# Patient Record
Sex: Male | Born: 1940 | Race: White | Hispanic: No | Marital: Married | Smoking: Current every day smoker
Health system: Southern US, Community
[De-identification: ages and names within clinical notes are randomized; demographics above are authoritative.]

## PROBLEM LIST (undated history)

## (undated) DIAGNOSIS — I998 Other disorder of circulatory system: Secondary | ICD-10-CM

## (undated) DIAGNOSIS — I255 Ischemic cardiomyopathy: Secondary | ICD-10-CM

## (undated) DIAGNOSIS — I739 Peripheral vascular disease, unspecified: Secondary | ICD-10-CM

## (undated) DIAGNOSIS — I4891 Unspecified atrial fibrillation: Secondary | ICD-10-CM

## (undated) DIAGNOSIS — Z4502 Encounter for adjustment and management of automatic implantable cardiac defibrillator: Secondary | ICD-10-CM

## (undated) DIAGNOSIS — I2510 Atherosclerotic heart disease of native coronary artery without angina pectoris: Secondary | ICD-10-CM

## (undated) DIAGNOSIS — Z720 Tobacco use: Secondary | ICD-10-CM

## (undated) DIAGNOSIS — J449 Chronic obstructive pulmonary disease, unspecified: Secondary | ICD-10-CM

## (undated) DIAGNOSIS — E785 Hyperlipidemia, unspecified: Secondary | ICD-10-CM

## (undated) DIAGNOSIS — Z9889 Other specified postprocedural states: Secondary | ICD-10-CM

## (undated) DIAGNOSIS — I70229 Atherosclerosis of native arteries of extremities with rest pain, unspecified extremity: Secondary | ICD-10-CM

## (undated) DIAGNOSIS — E119 Type 2 diabetes mellitus without complications: Secondary | ICD-10-CM

## (undated) DIAGNOSIS — C679 Malignant neoplasm of bladder, unspecified: Secondary | ICD-10-CM

## (undated) HISTORY — DX: Type 2 diabetes mellitus without complications: E11.9

## (undated) HISTORY — DX: Hyperlipidemia, unspecified: E78.5

## (undated) HISTORY — PX: TRANSURETHRAL RESECTION OF BLADDER TUMOR: SHX2575

## (undated) HISTORY — PX: ABDOMINAL AORTIC ANEURYSM REPAIR: SUR1152

## (undated) HISTORY — DX: Unspecified atrial fibrillation: I48.91

## (undated) HISTORY — DX: Ischemic cardiomyopathy: I25.5

## (undated) HISTORY — DX: Other disorder of circulatory system: I99.8

## (undated) HISTORY — PX: OTHER SURGICAL HISTORY: SHX169

## (undated) HISTORY — DX: Chronic obstructive pulmonary disease, unspecified: J44.9

## (undated) HISTORY — DX: Tobacco use: Z72.0

## (undated) HISTORY — PX: CARDIAC DEFIBRILLATOR PLACEMENT: SHX171

## (undated) HISTORY — PX: CHOLECYSTECTOMY: SHX55

## (undated) HISTORY — DX: Atherosclerosis of native arteries of extremities with rest pain, unspecified extremity: I70.229

## (undated) HISTORY — DX: Other specified postprocedural states: Z98.890

## (undated) HISTORY — DX: Malignant neoplasm of bladder, unspecified: C67.9

## (undated) HISTORY — DX: Atherosclerotic heart disease of native coronary artery without angina pectoris: I25.10

## (undated) HISTORY — DX: Peripheral vascular disease, unspecified: I73.9

## (undated) HISTORY — DX: Encounter for adjustment and management of automatic implantable cardiac defibrillator: Z45.02

## (undated) SURGERY — Surgical Case
Anesthesia: *Unknown

---

## 1997-10-27 ENCOUNTER — Inpatient Hospital Stay (HOSPITAL_COMMUNITY): Admission: RE | Admit: 1997-10-27 | Discharge: 1997-11-04 | Payer: Self-pay | Admitting: *Deleted

## 2001-04-03 ENCOUNTER — Ambulatory Visit (HOSPITAL_COMMUNITY): Admission: RE | Admit: 2001-04-03 | Discharge: 2001-04-03 | Payer: Self-pay | Admitting: Urology

## 2001-04-03 ENCOUNTER — Encounter: Payer: Self-pay | Admitting: Urology

## 2001-04-20 ENCOUNTER — Other Ambulatory Visit: Admission: RE | Admit: 2001-04-20 | Discharge: 2001-04-20 | Payer: Self-pay | Admitting: Urology

## 2001-06-21 ENCOUNTER — Ambulatory Visit (HOSPITAL_COMMUNITY): Admission: AD | Admit: 2001-06-21 | Discharge: 2001-06-21 | Payer: Self-pay | Admitting: Urology

## 2001-06-25 ENCOUNTER — Ambulatory Visit (HOSPITAL_COMMUNITY): Admission: RE | Admit: 2001-06-25 | Discharge: 2001-06-25 | Payer: Self-pay | Admitting: Urology

## 2001-06-26 ENCOUNTER — Emergency Department (HOSPITAL_COMMUNITY): Admission: EM | Admit: 2001-06-26 | Discharge: 2001-06-26 | Payer: Self-pay | Admitting: Emergency Medicine

## 2001-06-26 ENCOUNTER — Encounter: Payer: Self-pay | Admitting: Emergency Medicine

## 2001-06-29 ENCOUNTER — Encounter: Admission: RE | Admit: 2001-06-29 | Discharge: 2001-06-29 | Payer: Self-pay | Admitting: Oncology

## 2001-06-29 ENCOUNTER — Encounter (HOSPITAL_COMMUNITY): Admission: RE | Admit: 2001-06-29 | Discharge: 2001-07-29 | Payer: Self-pay | Admitting: Oncology

## 2001-07-05 ENCOUNTER — Inpatient Hospital Stay (HOSPITAL_COMMUNITY): Admission: RE | Admit: 2001-07-05 | Discharge: 2001-07-06 | Payer: Self-pay | Admitting: Urology

## 2001-07-17 ENCOUNTER — Ambulatory Visit (HOSPITAL_COMMUNITY): Admission: RE | Admit: 2001-07-17 | Discharge: 2001-07-17 | Payer: Self-pay | Admitting: Urology

## 2001-07-17 ENCOUNTER — Encounter: Payer: Self-pay | Admitting: Urology

## 2001-09-10 ENCOUNTER — Ambulatory Visit (HOSPITAL_COMMUNITY): Admission: RE | Admit: 2001-09-10 | Discharge: 2001-09-10 | Payer: Self-pay | Admitting: Urology

## 2001-09-10 ENCOUNTER — Encounter: Payer: Self-pay | Admitting: Urology

## 2001-10-19 ENCOUNTER — Inpatient Hospital Stay (HOSPITAL_COMMUNITY): Admission: EM | Admit: 2001-10-19 | Discharge: 2001-10-29 | Payer: Self-pay | Admitting: *Deleted

## 2001-10-19 ENCOUNTER — Encounter: Payer: Self-pay | Admitting: General Surgery

## 2001-10-19 ENCOUNTER — Encounter: Payer: Self-pay | Admitting: Emergency Medicine

## 2001-10-20 ENCOUNTER — Encounter: Payer: Self-pay | Admitting: General Surgery

## 2001-10-21 ENCOUNTER — Encounter: Payer: Self-pay | Admitting: General Surgery

## 2001-10-22 ENCOUNTER — Encounter (INDEPENDENT_AMBULATORY_CARE_PROVIDER_SITE_OTHER): Payer: Self-pay | Admitting: Cardiology

## 2001-10-23 ENCOUNTER — Encounter: Payer: Self-pay | Admitting: General Surgery

## 2002-04-11 ENCOUNTER — Ambulatory Visit (HOSPITAL_COMMUNITY): Admission: RE | Admit: 2002-04-11 | Discharge: 2002-04-11 | Payer: Self-pay | Admitting: Urology

## 2002-04-11 ENCOUNTER — Encounter: Payer: Self-pay | Admitting: Urology

## 2004-06-29 ENCOUNTER — Ambulatory Visit (HOSPITAL_COMMUNITY): Admission: RE | Admit: 2004-06-29 | Discharge: 2004-06-29 | Payer: Self-pay | Admitting: Cardiovascular Disease

## 2005-08-23 ENCOUNTER — Ambulatory Visit (HOSPITAL_COMMUNITY): Admission: RE | Admit: 2005-08-23 | Discharge: 2005-08-23 | Payer: Self-pay | Admitting: Cardiovascular Disease

## 2005-08-25 ENCOUNTER — Inpatient Hospital Stay (HOSPITAL_COMMUNITY): Admission: AD | Admit: 2005-08-25 | Discharge: 2005-08-26 | Payer: Self-pay | Admitting: Cardiovascular Disease

## 2006-02-20 ENCOUNTER — Ambulatory Visit (HOSPITAL_COMMUNITY): Admission: RE | Admit: 2006-02-20 | Discharge: 2006-02-20 | Payer: Self-pay | Admitting: Cardiovascular Disease

## 2008-01-10 ENCOUNTER — Ambulatory Visit (HOSPITAL_COMMUNITY): Admission: RE | Admit: 2008-01-10 | Discharge: 2008-01-10 | Payer: Self-pay | Admitting: General Surgery

## 2008-12-10 ENCOUNTER — Emergency Department (HOSPITAL_COMMUNITY): Admission: EM | Admit: 2008-12-10 | Discharge: 2008-12-10 | Payer: Self-pay | Admitting: Emergency Medicine

## 2009-03-23 ENCOUNTER — Encounter: Admission: RE | Admit: 2009-03-23 | Discharge: 2009-03-23 | Payer: Self-pay | Admitting: Cardiovascular Disease

## 2010-02-16 ENCOUNTER — Ambulatory Visit (HOSPITAL_COMMUNITY): Admission: RE | Admit: 2010-02-16 | Discharge: 2010-02-16 | Payer: Self-pay | Admitting: Urology

## 2010-04-13 ENCOUNTER — Ambulatory Visit (HOSPITAL_COMMUNITY): Admission: RE | Admit: 2010-04-13 | Discharge: 2010-04-13 | Payer: Self-pay | Admitting: Urology

## 2010-08-03 LAB — CBC
Hemoglobin: 12.5 g/dL — ABNORMAL LOW (ref 13.0–17.0)
MCH: 28 pg (ref 26.0–34.0)
Platelets: 116 10*3/uL — ABNORMAL LOW (ref 150–400)

## 2010-08-03 LAB — PROTIME-INR
INR: 1.36 (ref 0.00–1.49)
Prothrombin Time: 17 seconds — ABNORMAL HIGH (ref 11.6–15.2)

## 2010-08-03 LAB — SURGICAL PCR SCREEN
MRSA, PCR: NEGATIVE
Staphylococcus aureus: NEGATIVE

## 2010-08-03 LAB — APTT: aPTT: 34 seconds (ref 24–37)

## 2010-08-03 LAB — BASIC METABOLIC PANEL
CO2: 28 mEq/L (ref 19–32)
Sodium: 139 mEq/L (ref 135–145)

## 2010-08-05 LAB — COMPREHENSIVE METABOLIC PANEL
AST: 18 U/L (ref 0–37)
Alkaline Phosphatase: 120 U/L — ABNORMAL HIGH (ref 39–117)
Chloride: 103 mEq/L (ref 96–112)
Creatinine, Ser: 1.04 mg/dL (ref 0.4–1.5)
GFR calc non Af Amer: >60 mL/min (ref >60)
Total Bilirubin: 1.9 mg/dL — ABNORMAL HIGH (ref 0.3–1.2)
Total Protein: 7.6 g/dL (ref 6.0–8.3)

## 2010-08-05 LAB — PSA: PSA: 2.28 ng/mL (ref 0.10–4.00)

## 2010-08-05 LAB — URINALYSIS, ROUTINE W REFLEX MICROSCOPIC
Bilirubin Urine: NEGATIVE
Ketones, ur: NEGATIVE mg/dL
Leukocytes, UA: NEGATIVE
Specific Gravity, Urine: 1.013 (ref 1.005–1.030)
Urobilinogen, UA: 1 mg/dL (ref 0.0–1.0)

## 2010-08-05 LAB — APTT: aPTT: 35 seconds (ref 24–37)

## 2010-08-05 LAB — CBC
HCT: 40.8 % (ref 39.0–52.0)
MCHC: 33.5 g/dL (ref 30.0–36.0)
MCV: 87.4 fL (ref 78.0–100.0)
RBC: 4.67 MIL/uL (ref 4.22–5.81)
RDW: 15.6 % — ABNORMAL HIGH (ref 11.5–15.5)

## 2010-08-05 LAB — SURGICAL PCR SCREEN
MRSA, PCR: NEGATIVE
Staphylococcus aureus: NEGATIVE

## 2010-08-05 LAB — URINE CULTURE
Colony Count: NO GROWTH
Culture  Setup Time: 201109231719

## 2010-08-05 LAB — FREE PSA: PSA, Free Pct: 35 % (ref >25)

## 2010-08-29 LAB — DIFFERENTIAL
Eosinophils Absolute: 0.1 10*3/uL (ref 0.0–0.7)
Lymphs Abs: 1.5 10*3/uL (ref 0.7–4.0)
Monocytes Absolute: 1 10*3/uL (ref 0.1–1.0)
Monocytes Relative: 11 % (ref 3–12)
Neutro Abs: 6.3 10*3/uL (ref 1.7–7.7)
Neutrophils Relative %: 71 % (ref 43–77)

## 2010-08-29 LAB — URINALYSIS, ROUTINE W REFLEX MICROSCOPIC
Bilirubin Urine: NEGATIVE
Glucose, UA: 100 mg/dL — AB
Ketones, ur: NEGATIVE mg/dL
Leukocytes, UA: NEGATIVE
Nitrite: NEGATIVE
Protein, ur: NEGATIVE mg/dL
Specific Gravity, Urine: 1.02 (ref 1.005–1.030)
Urobilinogen, UA: 2 mg/dL — ABNORMAL HIGH (ref 0.0–1.0)
pH: 5.5 (ref 5.0–8.0)

## 2010-08-29 LAB — COMPREHENSIVE METABOLIC PANEL
ALT: 13 U/L (ref 0–53)
Albumin: 3.4 g/dL — ABNORMAL LOW (ref 3.5–5.2)
Calcium: 9.1 mg/dL (ref 8.4–10.5)
GFR calc Af Amer: >60 mL/min (ref >60)
Glucose, Bld: 157 mg/dL — ABNORMAL HIGH (ref 70–99)
Potassium: 4 mEq/L (ref 3.5–5.1)
Sodium: 136 mEq/L (ref 135–145)
Total Protein: 6.9 g/dL (ref 6.0–8.3)

## 2010-08-29 LAB — URINE MICROSCOPIC-ADD ON

## 2010-08-29 LAB — PROTIME-INR
INR: 2.2 — ABNORMAL HIGH (ref 0.00–1.49)
Prothrombin Time: 26.1 seconds — ABNORMAL HIGH (ref 11.6–15.2)

## 2010-08-29 LAB — CBC
Hemoglobin: 13.9 g/dL (ref 13.0–17.0)
MCHC: 34.5 g/dL (ref 30.0–36.0)
Platelets: 151 10*3/uL (ref 150–400)
RDW: 14.7 % (ref 11.5–15.5)

## 2010-08-30 ENCOUNTER — Other Ambulatory Visit: Payer: Self-pay | Admitting: Urology

## 2010-08-30 ENCOUNTER — Encounter (HOSPITAL_COMMUNITY): Payer: Medicare Other | Attending: Urology

## 2010-08-30 DIAGNOSIS — I509 Heart failure, unspecified: Secondary | ICD-10-CM | POA: Insufficient documentation

## 2010-08-30 DIAGNOSIS — C679 Malignant neoplasm of bladder, unspecified: Secondary | ICD-10-CM | POA: Insufficient documentation

## 2010-08-30 DIAGNOSIS — Z7982 Long term (current) use of aspirin: Secondary | ICD-10-CM | POA: Insufficient documentation

## 2010-08-30 DIAGNOSIS — Z951 Presence of aortocoronary bypass graft: Secondary | ICD-10-CM | POA: Insufficient documentation

## 2010-08-30 DIAGNOSIS — Z01812 Encounter for preprocedural laboratory examination: Secondary | ICD-10-CM | POA: Insufficient documentation

## 2010-08-30 DIAGNOSIS — I2510 Atherosclerotic heart disease of native coronary artery without angina pectoris: Secondary | ICD-10-CM | POA: Insufficient documentation

## 2010-08-30 DIAGNOSIS — E119 Type 2 diabetes mellitus without complications: Secondary | ICD-10-CM | POA: Insufficient documentation

## 2010-08-30 DIAGNOSIS — I10 Essential (primary) hypertension: Secondary | ICD-10-CM | POA: Insufficient documentation

## 2010-08-30 DIAGNOSIS — Z79899 Other long term (current) drug therapy: Secondary | ICD-10-CM | POA: Insufficient documentation

## 2010-08-30 DIAGNOSIS — I252 Old myocardial infarction: Secondary | ICD-10-CM | POA: Insufficient documentation

## 2010-08-30 LAB — CBC
MCH: 26.5 pg (ref 26.0–34.0)
MCHC: 32.3 g/dL (ref 30.0–36.0)
Platelets: 114 10*3/uL — ABNORMAL LOW (ref 150–400)

## 2010-08-30 LAB — SURGICAL PCR SCREEN
MRSA, PCR: NEGATIVE
Staphylococcus aureus: NEGATIVE

## 2010-08-30 LAB — BASIC METABOLIC PANEL
Calcium: 9.2 mg/dL (ref 8.4–10.5)
Creatinine, Ser: 1.06 mg/dL (ref 0.4–1.5)
GFR calc Af Amer: >60 mL/min (ref >60)
GFR calc non Af Amer: >60 mL/min (ref >60)

## 2010-08-30 LAB — PROTIME-INR
INR: 2.37 — ABNORMAL HIGH (ref 0.00–1.49)
Prothrombin Time: 26 seconds — ABNORMAL HIGH (ref 11.6–15.2)

## 2010-08-30 LAB — APTT: aPTT: 41 seconds — ABNORMAL HIGH (ref 24–37)

## 2010-09-03 ENCOUNTER — Other Ambulatory Visit: Payer: Self-pay | Admitting: Urology

## 2010-09-03 ENCOUNTER — Ambulatory Visit (HOSPITAL_COMMUNITY)
Admission: RE | Admit: 2010-09-03 | Discharge: 2010-09-03 | Disposition: A | Discharge status: Home / Self Care | Payer: Medicare Other | Source: Ambulatory Visit | Attending: Urology | Admitting: Urology

## 2010-09-03 DIAGNOSIS — Z79899 Other long term (current) drug therapy: Secondary | ICD-10-CM | POA: Insufficient documentation

## 2010-09-03 DIAGNOSIS — I252 Old myocardial infarction: Secondary | ICD-10-CM | POA: Insufficient documentation

## 2010-09-03 DIAGNOSIS — N400 Enlarged prostate without lower urinary tract symptoms: Secondary | ICD-10-CM | POA: Insufficient documentation

## 2010-09-03 DIAGNOSIS — I2510 Atherosclerotic heart disease of native coronary artery without angina pectoris: Secondary | ICD-10-CM | POA: Insufficient documentation

## 2010-09-03 DIAGNOSIS — Z8551 Personal history of malignant neoplasm of bladder: Secondary | ICD-10-CM | POA: Insufficient documentation

## 2010-09-03 DIAGNOSIS — E119 Type 2 diabetes mellitus without complications: Secondary | ICD-10-CM | POA: Insufficient documentation

## 2010-09-03 DIAGNOSIS — Z951 Presence of aortocoronary bypass graft: Secondary | ICD-10-CM | POA: Insufficient documentation

## 2010-09-03 DIAGNOSIS — Z7982 Long term (current) use of aspirin: Secondary | ICD-10-CM | POA: Insufficient documentation

## 2010-09-03 DIAGNOSIS — N3289 Other specified disorders of bladder: Secondary | ICD-10-CM | POA: Insufficient documentation

## 2010-09-03 DIAGNOSIS — I10 Essential (primary) hypertension: Secondary | ICD-10-CM | POA: Insufficient documentation

## 2010-09-18 NOTE — Op Note (Signed)
  NAME:  JOSTEN, WARMUTH NO.:  [id number]  MEDICAL RECORD NO.:  [id number]           PATIENT TYPE:  O  LOCATION:  DAYL                         FACILITY:  [facility]  PHYSICIAN:  Danae Chen, M.D.  DATE OF BIRTH:  [birth year]  DATE OF PROCEDURE:  09/03/2010 DATE OF DISCHARGE:                              OPERATIVE REPORT   PREOPERATIVE DIAGNOSIS:  Rule out recurrent bladder tumor.  POSTOPERATIVE DIAGNOSIS:  Rule out recurrent bladder tumor.  PROCEDURE:  Cystoscopy, transurethral resection bladder biopsy, and random cold cup bladder biopsy.  SURGEON:  Danae Chen, M.D.  ANESTHESIA:  General.  INDICATIONS:  The patient is a 70 year old male who had TUR of bladder tumor on February 16, 2010.  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  Repeat bladder biopsy on April 13, 2010, showed scattered fragment of high-grade TCC without invasion.  The patient was treated with intravesical BCG/interferon.  He is scheduled today for cystoscopy and TUR bladder biopsy.  DESCRIPTION OF PROCEDURE:  The patient was identified by his wristband and proper time-out was taken.  Under general anesthesia, he was prepped and draped and placed in the dorsal lithotomy position.  A panendoscope was inserted in the bladder. The urethra was normal.  He had moderate prostatic hypertrophy.  There was no stone or tumor in the bladder.  The ureteral orifices were in normal position and shape.  The bladder was then irrigated with normal saline and bladder washings were sent for cytology.  The cystoscope was then removed.  The urethra was dilated up to #28-French, then a #26 resectoscope was inserted in the bladder.  A TUR biopsy of the previous resected area of bladder tumor was done.  The resectoscope was then removed.  The cystoscope was then reinserted in the bladder and a random cold cup biopsy of the bladder was done.  The cystoscope was removed. The resectoscope was again  reinserted in the bladder and the areas of biopsy were fulgurated.  There was no evidence of bleeding at the end of the procedure.  The resectoscope was then removed.  A #16 Foley catheter was inserted in the bladder.  The patient tolerated the procedure well and left the OR in satisfactory condition to postanesthesia care unit.    Danae Chen, M.D.    MN/MEDQ  D:  09/03/2010  T:  09/03/2010  Job:  [id number]  Electronically Signed by Lindaann Slough M.D. on 09/18/2010 10:46:56 AM

## 2010-10-05 NOTE — Op Note (Signed)
NAME:  Jose Mcgrath, SHIFFLER NO.:  [id number]   MEDICAL RECORD NO.:  [id number]          PATIENT TYPE:  AMB   LOCATION:  DAY                          FACILITY:  [facility]   PHYSICIAN:  Lennie Muckle, MD      DATE OF BIRTH:  [birth year]   DATE OF PROCEDURE:  01/10/2008  DATE OF DISCHARGE:                               OPERATIVE REPORT   PREOPERATIVE DIAGNOSIS:  Right inguinal hernia.   POSTOPERATIVE DIAGNOSIS:  Right inguinal hernia.   PROCEDURE:  Open inguinal hernia repair with mesh.   SURGEON:  Lennie Muckle, MD   ASSISTANT:  None.   ANESTHESIA:  General endotracheal anesthesia.   FINDINGS:  Large hernia on the right.   BLOOD LOSS:  A minimal amount of blood loss.   COMPLICATIONS:  No immediate complications.   DRAINS:  No drains were placed.   SPECIMENS:  No specimens were sent.   INDICATIONS FOR PROCEDURE:  Mr. Pietrzak is a 70 year old male who had,  had a hernia for well over a year.  He occasionally had pain, and it was  worsened by exertion and activity.  No nausea or vomiting.  He desired  to have repair of his hernia due to increasing amounts of pain.  Informed consent was obtained after the risks of surgery, including but  not limited to bleeding, infection, hernia recurrence, risk of injury to  the spermatic cord and vessels.   DESCRIPTION OF PROCEDURE:  Identified in the preoperative holding area.  The patient was taken to the operating room.  Once in the operating  room, placed in the supine position.  After administration of general  endotracheal anesthesia, his abdomen and groin were prepped and draped  in the usual sterile fashion.  A timeout procedure was performed.  Using  anatomic landmarks for the pubic tubercle and the anterior iliac spine,  I placed an incision in the vicinity of the ilioinguinal ligament.  I  cut through subcutaneous tissues with the knife.  Using Metzenbaum  scissors, I cut through Scarpa's fascia, bringing it down into  the  external oblique fascia.  I made an incision with a #10 blade, carried  the incision in the external oblique fascia with the Metzenbaum  scissors, carrying it down to the external ring.  I encountered the  large hernia and was able to dissect out the ilioinguinal ligament and  medially on the external oblique fascia in order to encircle the hernia  and the surrounding cord and vessels.  A Penrose drain was placed around  these structures.  I then dissected out the hernia sac away from the  surrounding cord and vessels.  The hernia sac was rather large and  dense.  I was able to isolate this away from the vessels and the vas  deferens.  Proximally, it was rather densely adhered.  I used Metzenbaum  scissors to continue with the dissection.  I used the 3-0 silk suture to  cut through cremasteric muscle fibers.  I used a bipolar for a mild  amount of oozing through the smaller vessels.  I was able  to ligate the  hernia sac after ensuring no intestinal contents.  I used a 2-0 silk  suture to ligate the sac.  This was allowed to return back to the  abdominal cavity.  I then reapproximated the internal ring with 2-0  Prolene suture, allowing space for the spermatic cord and vessels.  I  then secured a piece of 3 x 6 polypropylene mesh using 2-0 Prolene  starting at the pubic tubercle and performing a running suture around  the ilioinguinal ligament and medially at the transversalis muscle and  internal oblique muscle.  I overlapped the tails around the spermatic  cord and vessels creating a new internal ring.  I was able to palpate a  finger through the opening ensuring adequate room for mild edema.  The  tails were placed under the external oblique fascia.  The wound bed was  irrigated, there was no evidence of bleeding.  I reapproximated the  external oblique fascia with a 3-0 running Vicryl suture.  Scarpa's was  reapproximated with 3-0 Vicryl suture and the dermis was closed with 3-0   Vicryl suture.  The skin was closed with 4-0 Monocryl and Dermabond  placed around the dressing.  The patient was extubated and transferred  to the postanesthesia care unit in stable condition.  He will follow up  with me in approximately 2-3 weeks.  [name]  He will be given Percocet and  start ibuprofen tomorrow, as well as restart his Coumadin tomorrow.      Lennie Muckle, MD  Electronically Signed     ALA/MEDQ  D:  01/10/2008  T:  01/10/2008  Job:  [id number]   cc:   Dr. Burman Riis

## 2010-10-08 NOTE — Discharge Summary (Signed)
NAME:  Jose Mcgrath, Jose Mcgrath NO.:  [id number]   MEDICAL RECORD NO.:  [id number]          PATIENT TYPE:  INP   LOCATION:  6525                         FACILITY:  [facility]   PHYSICIAN:  Darcella Gasman. [place], N.P.  DATE OF BIRTH:  1940-11-13   DATE OF ADMISSION:  08/25/2005  DATE OF DISCHARGE:  08/26/2005                                 DISCHARGE SUMMARY   DISCHARGE DIAGNOSES:  1.  Status post Bi-V ICD implantation.  2.  Ischemic cardiomyopathy with class III heart failure with EF 25-30%.  3.  Chronic atrial fibrillation.  4.  Positive tobacco use.  5.  Abdominal aortic aneurysm resection by Dr. Madilyn Fireman in the past.  6.  Diabetes mellitus.  7.  Moderate hematoma at the defibrillator site.   CONDITION ON DISCHARGE:  Stable.   DISCHARGE MEDICATIONS:  1.  Cardec 12.5 mg in the morning and 6.25 in the evening.  2.  No Coumadin until Sunday evening, then  5 mg daily.  3.  May begin aspirin 81 mg on Saturday.  4.  Diltiazem 20 daily.  5.  Lanoxin 0.25 mg daily.  6.  Accupril 20 mg daily.  7.  Aldactone 25 mg one half tablet daily.  8.  Furosemide 40 mg daily.  9.  No Avandamet until Saturday evening and b.i.d. as before.   DISCHARGE INSTRUCTIONS:  1.  Low-fat diabetic diet, low-salt.  2.  See pacemaker discharge instruction sheet.  3.  Follow with Dr. Alanda Amass on August 30, 2005, they7 will call you with      the time.  4.  Have pro time done on Tuesday.   HISTORY OF PRESENT ILLNESS:  This is a white married male patient of Dr.  Kandis Cocking married with two children, four grandchildren and two great  grandchildren with class 3 heart failure, permanent atrial fibrillation on  Coumadin multivessel bypass grafting x4 in 1999 with recent Cardiolite study  negative for CA on June 16, 2005.  He has dilated LV at the left bundle  branch.  EF was 25-30%.  On 2D echocardiogram June 16, 2005, he had no  significant valvular disease, and intraventricular conduction had a remote  MI prior to 1999.  He has been treated medically for his dilated history of  cardiomyopathy.   It was thought he was a candidate for prophylactic ICD by the pacing unit.  He was admitted August 25, 2005 for elective procedure.   PAST MEDICAL HISTORY:  1.  AAA resection without pace.  2.  Tobacco abuse.  Continues to smoke.  3.  COPD.  4.  History of chronic atrial fibrillation.  Outpatient Holter showed      nonsustained ventricular tachycardia, atrial fibrillation with variable      rates.  5.  Diabetes mellitus type 2.  6.  Recent episode of overt heart failure over the last several months,      treated with diuretics.   FAMILY HISTORY/SOCIAL HISTORY/REVIEW OF SYSTEMS:  See HPI.   DISCHARGE PHYSICAL EXAMINATION:  VITAL SIGNS:  Blood pressure 150/74, pulse  98, respirations 18, temperature 99.8, oxygen saturation 92% on  room air.   LABORATORY DATA:  Post procedure, hemoglobin 14.3, hematocrit 41.1, WBC  11.4, platelets 150, sodium 135, potassium 3.3, BUN 11, creatinine 0.9,  glucose 192.  Chest x-ray showed no pneumothorax.  Stable EKG.  HEART:  Regular rate and rhythm.  LUNGS:  Clear.  ABDOMEN:  Soft, nontender.  Left subclavian pacer site with hematoma,  moderate size, 2-3 cm below the pacemaker generator.   HOSPITAL COURSE:  Mr. Kille was admitted by Dr. Alanda Amass for placement of  IV ICD for class 3 heart failure and chronic atrial fibrillation with  nonsustained ventricular tachycardia and prophylaxis to prevent sudden  death.  He tolerated the procedure well, and by the next morning he was  stable and felt he could be discharged home.  He will follow up with Dr.  Alanda Amass in the [place] office on Tuesday, April 10 for further  evaluation.      Darcella Gasman. Annie Paras, N.P.     LRI/MEDQ  D:  08/29/2005  T:  08/30/2005  Job:  [id number]   cc:   Kirk Ruths, M.D.  Fax: [phone]
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
## 2010-10-08 NOTE — Op Note (Signed)
NAME:  Jose Mcgrath, Jose Mcgrath NO.:  [id number]   MEDICAL RECORD NO.:  [id number]          PATIENT TYPE:  INP   LOCATION:  6525                         FACILITY:  [facility]   PHYSICIAN:  Richard A. Alanda Amass, M.D.DATE OF BIRTH:  Jul 04, 1940   DATE OF PROCEDURE:  08/25/2005  DATE OF DISCHARGE:                                 OPERATIVE REPORT   PROCEDURE:  Implantation of biventricular ICD Medtronic wireless Concerto  model number [id number]; SN: [id number] with active fixation screw-in  Medtronic dual coil rate sense defibrillator lead number 6949 - 65 cm  steroid eluding SN:  [id number] and Medtronic bipolar passive fixation  steroid eluding coronary sinus - LV lead number [id number] - 88 cm; SN:  [id number].  Coronary sinus angiogram, DFT testing using verification with  successful device defibrillation with induced VF at 20 joules delivered  energy. High output generator implant.   IMPLANTING PHYSICIAN:  Richard A. Alanda Amass, M.D.   COMPLICATIONS:  None.   ESTIMATED BLOOD LOSS:  Approximately 70 mL.   ANTIBIOTIC PREOPERATIVE PROPHYLAXIS:  1 gram Ancef IV to be continued  postoperatively.   ANESTHESIA:  5 mg Valium p.o. premedication, 1% local Xylocaine, 5 mg Versed  IV in divided doses, 2 mg Nubain in divided doses, 100 mcg fentanyl in  divided doses, 0.25 mg Romazicon for Versed reversal postop.   PREOPERATIVE DIAGNOSES:  1.  Ischemic congestive cardiomyopathy.  2.  Recent class IV heart failure, current class III heart failure on      optimal medical therapy.  3.  Remote coronary artery bypass graft x4 in 1999.  4.  Remote myocardial infarction DMI in 1999.  5.  Remote abdominal aortic aneurysm resection in 1999.  6.  Adult onset diabetes mellitus.  7.  Nonsustained VT asymptomatic.  8.  Sick sinus syndrome permanent atrial fibrillation with giant atrium.  9.  Left bundle branch block on EKG with QRS 0.138.   Jose Mcgrath is a 70 year old, married, father of  2 with 4  grandchildren, 2  great grandchildren who has an ischemic cardiomyopathy with recent class IV  heart failure. He is on optimal medical therapy with permanent atrial  fibrillation and left bundle branch block.  He meets criteria for ICD  implantation based on both SCD-HeFT and made it to criteria for EF less than  30% documented on 2-D echo and past Cardiolite, prior myocardial infarction.  He meets criteria for biventricular pacing with left bundle branch block  intraventricular dyssynchrony on 2-D echo with prolonged QRS.  Informed  consent was obtained from the patient and his wife to proceed with  implantation and DFT testing.   THRESHOLDS:  RV:  R equal 17.0 MV, impedance 591 ohms, threshold 0.5 V at  0.5 milliseconds.   LV:  R equal 13.6 MV, impedance 414 ohms, threshold 1.7 V at 0.5  milliseconds.   Device measurements postoperatively were similar to lead measurements post  implantation.   Current of entry was good and there was no diaphragmatic or esophageal  pacing at 10 volt output on RV or LV leads through PSA.   The patient  was brought to the second floor CP lab in a post absorptive  state after 5 mg of Valium p.o. premedication.  Both the right groin and  left anterior chest were prepped, draped in the usual manner.  1% Xylocaine  was used for local anesthesia for the left anterior chest and an  infraclavicular curvilinear transverse incision was performed.  The patient  had been on Coumadin which was held preoperatively and INR was 1.21 on the  day of the procedure.  He also had been on baby aspirin which was held on  the day of the procedure.  The incision was brought down to the prepectoral  fascia using blunt dissection and electrocautery to control hemostasis.  The  extrathoracic subclavian vein was entered under fluoroscopic control with  two separate punctures with a #18 thin-wall needle.  Initially a 7-French  introducer was used for the RV lead.  The RV lead was  positioned in the RV  apex and initially a passive fixation Medtronic electrode was utilized,  Medtronic 6948 - 65 cm.  Because of the extremely large heart, there was  some difficulty in positioning this but this was positioned in the RV apex  and appeared stable.  Through the second puncture, we then introduced a 9.5  [place] safe sheath.  Through this, we initially used a Attain MB 2 guiding  sheath.  This however was too short with not enough curve to adequately  reach the coronary sinus, although the coronary sinus was located.  Also the  RV lead was dislodged during manipulation of the Attain sheath.  The Attain  sheath was removed, the RV lead was removed and another puncture was done  with an 18 thin-wall needle under fluoroscopic control to the extrathoracic  subclavian and a #9 peel-away Cook introducer was introduced along with a  Medtronic 6949, 65 cm active fixation screw-in electrode.  The electrode was  positioned in the RV apex and screwed in under fluoroscopic control and  remained stable throughout the remainder of the procedure. Backup pacing was  not necessary but was available during the procedure.  We then used a  multipurpose Attain [id number] guiding sheath through the previously placed safe  sheath.  We were able to selectively catheterize the coronary sinus with  this and a Wholey wire was used to enter the coronary sinus to the mid  portion. 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  We were able to get the  guiding sheath into the proximal portion of the coronary sinus but then  there was a very acute bend beyond this to get into the main coronary sinus.  We were not able to get the guiding sheath up with that.  We had to use  several Attain select catheters. We also attempted to put a balloon catheter  in however, we did not have a long exchange wire so this was not possible so this was  abandoned.  Through Attain select number [phone] 6-French subselective  catheter, we were able to subselect a large posterolateral vein.  Prior to  this, coronary sinus angiography was done in the PA, RAO and LAO projections  through the guiding sheath and subselective injections were done through the  subselective catheter.  This demonstrated a large inferior vein that was not  suitable because of its location for LV lead placement.  There was only one  large posterolateral vein visualized  which was our target vein.  Through the  subselective catheter, I  was then able to pass a Wholey wire a little bit  further into the posterolateral vein, advance the subselective catheter,  remove the Wholey wire.  I then used a 0.014 inch Asahi soft guidewire  initially.  The subselective catheter was removed and we tried to thread a  bipolar lead over this wire into the coronary posterolateral vein however,  because of the marked tortuosity and bend proximally and the proximal bend  of the posterolateral vein, we were not able to get across this so this was  removed.  We then passed the subselective catheter back over the guidewire  and changed guidewires to a size 0.014 inch [place]ater wire which was stiffer.  This was positioned in the distal port of the posterolateral vein,  subselective catheter was removed.  I was then able through careful  manipulation and push pull technique able to get the bipolar electrode  around the proximal coronary sinus bend and angle and around the proximal  posterolateral vein angle, out into the distal portion of the posterolateral  vein.  It was quite stable in this position and there was no diaphragmatic  pacing at 10 volt output bipolar.  Several configurations were tried for  obtaining an adequate threshold.  The best threshold obtained was using an  integrated bipolar configuration with the cathode on the LV proximal ring  and the anode on the RV coil.  There was no  diaphragmatic pacing with this  and this provided the best stimulation threshold and excellent R waves with  good current of entry.  The lead was stable in this position.  Under  fluoroscopic control, the sheath was then pulled back to the right atrium.  The safe sheath was slid under fluoroscopic control and then the  multipurpose guiding sheath was then slit using slitting technique.  The  lead remained stable without any movement.  The atrial electrode was secured  at the insertion site with the previously placed #1 figure-of-eight silk  suture to prevent migration.  The extra stainless steel J-tip guidewire had  already been removed after positioning the RV electrode.  The RV electrode  was likewise secured with a #1 silk suture, a figure-of-eight silk suture  around the tissue sewing collar to prevent migration.  Each lead was further  secured two #1 silk sutures around a Silastic sewing collar for each  electrode.  Despite considerable electrocautery probably related to the patient's past Coumadin and aspirin therapy, there was considerable oozing  which required electrocautery.  Avitene was used in the pocket and was  fairly successful but I had to use a thrombin mixture 1 mL in the pocket to  help obtain hemostasis.  The generator was then hooked to the electrodes and  the proper sequence confirmed by serial numbers.  The atrial port was  plugged with an atrial plug.  A hex nut was tightened for each electrode.  The pin plug was a Medtronic [id number]. The pocket had previously been irrigated  with 500 mg of kanamycin solution.  The generator was delivered into the  pocket with the electrodes looped behind and loosely secured at the  underlying muscle and fascia with #1 silk suture to prevent migration.  The  subcutaneous tissue was closed with two separate running layers of Vicryl  suture and the skin was closed with 4-0 subcuticular Vicryl suture, Steri-  Strips were applied.  The wound  was dry, there  was no bleeding around the  incision site but extra pressure was held after closing the wound.  Wireless  VF induction was then done through the H[employer]ler Medtronic ICD.  VF was  induced with shock on T with a 400 millisecond pacing train, a coupling  interval of 300 milliseconds and a 1 joule shock on T.  VF was induced.  Two  external bipolar cardioverters were prepared and ready and prior to VF  induction, the patient was adequately anesthetized by respiratory therapy.  VF was detected without dropouts with sensing at 1.2 mV and the patient was  successfully rescued with a single verification DFT shock of 20 joules  promptly to sinus rhythm.  The episode had lasted 6 seconds and high voltage  impedance was 45 ohms.  The patient tolerated this well.  He was given 2.25  mg for Romazicon to partially reverse his Versed.  He tolerated the  procedure well and was transferred from the laboratory to the holding area  for postoperative programming in stable condition.      Richard A. Alanda Amass, M.D.  Electronically Signed     RAW/MEDQ  D:  08/25/2005  T:  08/26/2005  Job:  [id number]   cc:   CP Lab   Kirk Ruths, M.D.  Fax: [phone]   EP Lab

## 2010-10-08 NOTE — Discharge Summary (Signed)
[facility]  Patient:    Jose Mcgrath, Jose Mcgrath Visit Number: [id number] MRN: [id number]          Service Type: TRA Location: 4700 [id number] Attending Physician:  Trauma, Md Dictated by:   Dennie Maizes, M.D. Admit Date:  10/19/2001 Discharge Date: 10/29/2001               Karleen Hampshire, M.D.   Discharge Summary  CONSULTING PHYSICIAN:  Karleen Hampshire, M.D.  FINAL DIAGNOSES: 1. 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 2. Hematuria. 3. Coronary artery disease. 4. Hypertension.  OPERATIVE PROCEDURE:  Cystoscopy, multiple bladder biopsies, and transurethral resection of the bladder tumor, July 05, 2001.  COMPLICATIONS:  None.  DISCHARGE SUMMARY:  This 70 year old male was referred to me by Dr. Regino Schultze for evaluation of intermittent mild gross hematuria.  The patient has been on anticoagulation therapy for several years.  He has noticed intermittent mild hematuria about 6 months ago which cleared spontaneously.  He had recurrent hematuria a few months ago and he was referred to me for further evaluation. The patient did not have any voiding difficulty.  He has good urinary flow. He has urinary frequency x5, and nocturia x1.  He has no fever, flank pain, chills, dysuria, or urinary incontinence.  There is no past history of urinary tract infection or urolithiasis.  PAST MEDICAL HISTORY: 1. History of coronary artery disease, status post coronary artery bypass    grafting 3 years ago. 2. Status post abdominal aortic aneurysm repair. 3. History of hypertension. 4. There is no history of diabetes mellitus. 5. He had borderline hypertension which is controlled with diet.  MEDICATIONS: 1. Lanoxin. 2. Diltiazem. 3. Coumadin 10 mg 1 p.o. q.d. to stop for the surgery.  ALLERGIES:  None.  FAMILY HISTORY:  Negative for GU disease, kidney disease or malignancy.  REVIEW OF SYSTEMS:  Positive for perioral numbness, and tingling, high  blood pressure and urinary frequency.  PHYSICAL EXAMINATION:  GENERAL:  Height 6 feet 2 inches, weight 235 pounds.  HEENT:  Normal.  NECK:  No masses.  LUNGS: Clear to auscultation.  HEART:  Irregular rate and rhythm with no murmurs.  Medium sternotomy scar is noted.  ABDOMEN:  Midline incision is noted.  No costovertebral angle tenderness. Bladder not palpable.  GENITOURINARY:  Penis and testes are normal.  RECTAL:  Benign prostate.  The patient underwent further evaluation in the office.  Renal function was normal with a BUN of 12, creatinine 1.0, urine cytology revealed no evidence of malignant cells.  A repeat tomogram done at [facility] revealed normal kidneys and collecting system. There was a large lobulated filling defect in the left lateral wall of the bladder.  HOSPITAL COURSE:  The patient Coumadin stopped and the PT and PTT came within normal range prior to surgery.  He was taken to the OR on July 05, 2001. Under spinal anesthesia cystoscopy and multiple bladder biopsies and transurethral resection of a 4 cm x 3 cm sized bladder tumor were done. The patient was seen by Dr. Nobie Putnam and Dr. Sherwood Gambler in follow up.  He did well postoperatively.  His labs are within the normal range.  The bladder irrigation was discontinued and the urine was clear.  The Foley catheter was discontinued.  The patient received lovenox until his INR became therapeutic. His Coumadin was restarted in the postoperative period.  DISPOSITION:  The patient was discharged and sent home on July 06, 2001.  FOLLOWUP:  An appointment with his  the patients cardiologist has been made for regulating the doses of Coumadin.  FINAL PATHOLOGY:  Final pathology of the bladder biopsies revealed no evidence of malignancy. 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  The patient will be reviewed in the office  in 2 weeks and further staging of the carcinoma of the bladder will be done. Dictated by:   Dennie Maizes, M.D. Attending Physician:  Trauma, Md DD:  10/24/01 TD:  10/29/01 Job: [id number]

## 2010-10-08 NOTE — Procedures (Signed)
   NAME:  Jose Mcgrath, Jose Mcgrath                        ACCOUNT NO.:  [id number]   MEDICAL RECORD NO.:  [id number]                   PATIENT TYPE:  NP   LOCATION:  4714                                 FACILITY:  [facility]   PHYSICIAN:  Fredirick Maudlin, M.D.              DATE OF BIRTH:  1941/02/16   DATE OF PROCEDURE:  10/19/2001  DATE OF DISCHARGE:  10/29/2001                                EKG INTERPRETATION   The rhythm is atrial fibrillation with a ventricular response in the 70s.  There are T-wave abnormalities which are diffuse, but nonspecific.  Abnormal  electrocardiogram.                                               Fredirick Maudlin, M.D.    ELH/MEDQ  D:  12/20/2001  T:  12/26/2001  Job:  [id number]

## 2010-11-25 ENCOUNTER — Other Ambulatory Visit (HOSPITAL_COMMUNITY): Payer: Self-pay | Admitting: Cardiovascular Disease

## 2010-11-25 ENCOUNTER — Ambulatory Visit (HOSPITAL_COMMUNITY)
Admission: RE | Admit: 2010-11-25 | Discharge: 2010-11-25 | Disposition: A | Discharge status: Home / Self Care | Payer: Medicare Other | Source: Ambulatory Visit | Attending: Cardiovascular Disease | Admitting: Cardiovascular Disease

## 2010-11-25 DIAGNOSIS — Z01811 Encounter for preprocedural respiratory examination: Secondary | ICD-10-CM

## 2010-11-25 DIAGNOSIS — Z01818 Encounter for other preprocedural examination: Secondary | ICD-10-CM | POA: Insufficient documentation

## 2010-11-30 ENCOUNTER — Ambulatory Visit (HOSPITAL_COMMUNITY)
Admission: RE | Admit: 2010-11-30 | Discharge: 2010-12-01 | Disposition: A | Discharge status: Home / Self Care | Payer: Medicare Other | Source: Ambulatory Visit | Attending: Cardiovascular Disease | Admitting: Cardiovascular Disease

## 2010-11-30 DIAGNOSIS — Z4502 Encounter for adjustment and management of automatic implantable cardiac defibrillator: Secondary | ICD-10-CM | POA: Insufficient documentation

## 2010-11-30 DIAGNOSIS — E785 Hyperlipidemia, unspecified: Secondary | ICD-10-CM | POA: Insufficient documentation

## 2010-11-30 DIAGNOSIS — Z8249 Family history of ischemic heart disease and other diseases of the circulatory system: Secondary | ICD-10-CM | POA: Insufficient documentation

## 2010-11-30 DIAGNOSIS — E119 Type 2 diabetes mellitus without complications: Secondary | ICD-10-CM | POA: Insufficient documentation

## 2010-11-30 DIAGNOSIS — I10 Essential (primary) hypertension: Secondary | ICD-10-CM | POA: Insufficient documentation

## 2010-11-30 DIAGNOSIS — J449 Chronic obstructive pulmonary disease, unspecified: Secondary | ICD-10-CM | POA: Insufficient documentation

## 2010-11-30 DIAGNOSIS — J4489 Other specified chronic obstructive pulmonary disease: Secondary | ICD-10-CM | POA: Insufficient documentation

## 2010-11-30 DIAGNOSIS — I739 Peripheral vascular disease, unspecified: Secondary | ICD-10-CM | POA: Insufficient documentation

## 2010-12-01 ENCOUNTER — Ambulatory Visit (HOSPITAL_COMMUNITY): Payer: Medicare Other

## 2010-12-01 LAB — PROTIME-INR
INR: 0.91 (ref 0.00–1.49)
Prothrombin Time: 12.5 seconds (ref 11.6–15.2)

## 2010-12-27 NOTE — Op Note (Signed)
NAME:  Jose Mcgrath, Jose Mcgrath NO.:  [id number]  MEDICAL RECORD NO.:  [id number]  LOCATION:                                 FACILITY:  PHYSICIAN:  Thurmon Fair, MD     DATE OF BIRTH:  1941/04/14  DATE OF PROCEDURE:  11/30/2010 DATE OF DISCHARGE:                              OPERATIVE REPORT   PROCEDURES PERFORMED: 1. Implantation of new right ventricular defibrillator lead. 2. Replacement of biventricular pacemaker/defibrillator generator due     to device at elective replacement interval. 3. Fluoroscopy. 4. Left upper extremity venogram. 5. Moderate sedation. 6. Defibrillation threshold testing lead and generator testing.  REASON FOR THE PROCEDURE: 1. Defibrillator generator at elective replacement interval. 2. Chronic defibrillator lead subject to recall (Medtronic Sprint     Fidelis defibrillator lead).  Medications administered in the procedure were Ancef 2 g IV, Versed 6 mg IV, and fentanyl 150 mcg IV, and lidocaine 1% 30 mL locally.  PROCEDURE PERFORMED BY:  Thurmon Fair, MD  COMPLICATIONS:  None.  ESTIMATED BLOOD LOSS:  10 mL.  Jose Mcgrath is a 70 year old man with severe congestive heart failure, severely depressed left ventricle systolic function and permanent atrial fibrillation who has a biventricular pacemaker operating as a single chamber device (atrial port is plugged due to atrial fibrillation and high-grade atrioventricular block).  His defibrillator lead is a Sprint Fidelis lead.  While this has not yet shown evidence of lead fracture or any changes impedance, the decision was made to place a new defibrillator lead at the time of elective generator replacement.  DEVICE DETAILS:  The new generator is a Medtronic Protecta model number [id number], serial number [id number].  The new defibrillator lead is a Child psychotherapist model number [id number] cm, serial number [id number].  After risks and benefits of the procedure  were described, the patient provided informed consent.  He was brought to the cardiac cath lab in a fasting state and the chest was prepped and draped in usual sterile fashion.  Local anesthesia 1% lidocaine was administered to the left prepectoral area in the area of the previously placed defibrillator.  A 6-cm horizontal incision was made parallel with the inferior border of the left clavicle.  Limited electrocautery was used and mostly blunt dissection was used to expose the chronic defibrillator pocket.  The defibrillator was explanted without difficulty.  The pocket was inspected for hemostasis which was found to be excellent.  Great care was taken at all times to avoid any injury to the chronic leads.  The 2 antibiotics soaked sponges were placed in the pocket at this point in the procedure.  Left upper extremity venogram had been performed and showed that the left subclavian vein was patent all the way to the superior vena cava. Using the modified Seldinger technique, a 9.5 French safe sheath was placed in the superior vena cava.  The [facility] wire was required to negotiate the tortuous vessel.  Subsequently, a new defibrillator lead was placed via the sheath and advanced into the level of the right atrium.  Considerable difficulty was encountered crossing the tricuspid valve due to a severely dilated right atrium and what appears to be severe tricuspid  insufficiency. Several shaped stylets were used to cross the tricuspid valve and obtain a stable position in the right ventricle.  The right ventricle itself also appeared to be markedly dilated.  The defibrillator lead preferentially engaged the coronary sinus repeatedly.  Eventually, the lead was placed in a satisfactory position in the mid to apical portion of the right ventricular septum.  The active fixation helix was deployed and prominent current of injury was noted.  Satisfactory sensing and pacing parameters were  encountered as well as good electrical impedance.  The safe sheath was peeled away and the lead was secured in place using 2-0 silk.  Subsequently, the chronic right ventricular lead and left ventricular lead were disconnected from the old generator.  The newly placed right ventricular lead was attached to the new biventricular pacemaker defibrillator generator.  The chronic left ventricular lead was tested and found to have parameters were unchanged from the preoperative procedure.  The left ventricular lead was also attached to the new generator.  The atrial port was plugged.  The chronic defibrillator lead was left in place and its 3-pins were all capped.  The pocket was reinspected for hemostasis after the antibiotic soaked sponges had been removed.  The new generator was then placed in the pocket with great care being taken that the active leads as well as the capped leads were located deep to the generator.  The pocket was then closed in layers using two layers of 2-0 Vicryl and titanium staples.  Defibrillation threshold testing was then performed.  Ventricular fibrillation was successfully induced with a 1 Joule shock on T procedure.  A single 25 joules shock successfully returned the rhythm to normal sinus rhythm without any dropout.  The total duration of ventricular fibrillation was 9 seconds.  The charge time was 4.9 seconds.  The high-voltage impedance was 33 ohms.  The surgical wound was then covered with a sterile dressing.  At the end of the procedure, the following electronic parameters were encountered.  Right ventricular lead sensed R-wave 8.9 mV, impedance 633 ohms, threshold 0.6 volts at 0.5 milliseconds pulse width, current 0.9 mA, high-voltage impedance proximal coil 38 ohms, distal coil 31 ohms (during actual shock 33 ohms).  Impedance 342 ohms and threshold 2.25 volts at 1.0 milliseconds pulse width (best configuration LV tip to RV coil).  Otherwise,  the device was reprogrammed as the old generator had been programmed.  No immediate complications occurred.     Thurmon Fair, MD     MC/MEDQ  D:  12/20/2010  T:  12/21/2010  Job:  [id number]  cc:   [facility]  Electronically Signed by Thurmon Fair M.D. on 12/27/2010 04:43:49 PM

## 2012-06-15 LAB — COMPREHENSIVE METABOLIC PANEL
Albumin: 3.1
BUN: 23 mg/dL — AB (ref 4–21)
CO2: 23 mmol/L
Chloride: 105 mmol/L
Creat: 0.97
Glucose: 127
Potassium: 4.7 mmol/L

## 2012-06-15 LAB — CBC WITH DIFFERENTIAL/PLATELET
MCH: 23.9
MCHC: 31.7
RDW: 16.9

## 2012-06-19 ENCOUNTER — Ambulatory Visit (INDEPENDENT_AMBULATORY_CARE_PROVIDER_SITE_OTHER): Payer: Medicare Other | Admitting: Gastroenterology

## 2012-06-19 ENCOUNTER — Encounter (HOSPITAL_COMMUNITY): Payer: Medicare Other | Attending: Gastroenterology

## 2012-06-19 ENCOUNTER — Encounter: Payer: Self-pay | Admitting: Gastroenterology

## 2012-06-19 ENCOUNTER — Other Ambulatory Visit: Payer: Self-pay

## 2012-06-19 VITALS — BP 130/60 | HR 68 | Temp 97.2°F | Ht 74.0 in | Wt 202.4 lb

## 2012-06-19 VITALS — BP 136/80 | HR 81 | Temp 97.1°F | Resp 20

## 2012-06-19 DIAGNOSIS — D649 Anemia, unspecified: Secondary | ICD-10-CM

## 2012-06-19 DIAGNOSIS — Z1211 Encounter for screening for malignant neoplasm of colon: Secondary | ICD-10-CM

## 2012-06-19 DIAGNOSIS — R748 Abnormal levels of other serum enzymes: Secondary | ICD-10-CM | POA: Insufficient documentation

## 2012-06-19 LAB — CBC WITH DIFFERENTIAL/PLATELET
Lymphocytes Relative: 20 % (ref 12–46)
Lymphs Abs: 1.4 10*3/uL (ref 0.7–4.0)
Neutrophils Relative %: 64 % (ref 43–77)
Platelets: 228 10*3/uL (ref 150–400)
RBC: 3.4 MIL/uL — ABNORMAL LOW (ref 4.22–5.81)
WBC: 6.7 10*3/uL (ref 4.0–10.5)

## 2012-06-19 LAB — PROTIME-INR
INR: 1.52 — ABNORMAL HIGH (ref 0.00–1.49)
Prothrombin Time: 17.9 seconds — ABNORMAL HIGH (ref 11.6–15.2)

## 2012-06-19 MED ORDER — OMEPRAZOLE 20 MG PO CPDR: 20.0000 mg | DELAYED_RELEASE_CAPSULE | Freq: Two times a day (BID) | ORAL | Status: DC

## 2012-06-19 NOTE — Addendum Note (Signed)
Addended by: Edythe Lynn A on: 06/19/2012 03:19 PM   Modules accepted: Orders, SmartSet

## 2012-06-19 NOTE — Progress Notes (Signed)
Luanna Cole presented for labwork. Labs per MD order drawn via Peripheral Line 25 gauge needle inserted in lt ac. Procedure without incident.  Needle removed intact. Patient tolerated procedure well. Expressed to patient concern over outpatient transfusion tomorrow based on his weakness. His NP has advised him to go thru ED for admission but pt refused. Labs pending. Pt comfortable in recliner.   HGB 8.1. Patient will return tomorrow for 1 unit prbc. Instructed to report to ed if condition worsens.

## 2012-06-19 NOTE — Assessment & Plan Note (Signed)
Upon review of labs in epic, appears isolated elevated alk phos in the past, including most recently mildly elevated at 130. Transaminases normal. Platelets normal but mildly low albumin. Will likely need further work-up once acute issue is resolved. Consider imaging in near future.

## 2012-06-19 NOTE — Progress Notes (Signed)
Faxed to PCP

## 2012-06-19 NOTE — Progress Notes (Signed)
Primary Care Physician:  Kirk Ruths, MD Primary Gastroenterologist:  Dr. Darrick Penna  Chief Complaint  Patient presents with  . Rectal Bleeding  . Abdominal Pain    HPI:   Jose Mcgrath is a 72 year old male who presents today at the request of Dr. Alanda Amass secondary to anemia. He has multiple comorbidities as listed in PMH; he is on Coumadin secondary to afib. Labs from Jan 23th  note Hgb 7.9, microcytic. This is a significant drop, with prior Hgb 11.4 in March 2013. Upon presentation to [place] today, original manual BP was 80/60. Pulse high 50s. Rechecked orthostatics and BP was consistently 130/60s. Drove by himself. Wife at home with grandkids.   Notes feeling weak, fatigued. Present for a few months. Thought he was just "down". Started to feel "so drunk" I couldn't get around. Fell several times. Never sought medical care. Feels weak, wakes up hungry every morning. Good appetite. Denies wt loss. No N/V. Denies abdominal pain. +melena reportedly a few weeks ago. intermittent hematochezia with straining. No dysphagia. No indigestion. Advil prn, last took a few weeks ago for a toothache.   Just had blood work completed today. No blood transfusion.   No prior colonoscopy. Thinks he may have had an upper endoscopy "years ago".  LAST DOSE OF COUMADIN Monday. HE IS REFUSING HOSPITAL ADMISSION. ADAMANT DESPITE DISCUSSION OF RISK TO INCLUDE DEATH.  Personally spoke to wife, who was at home, via telephone. states going on 3-4 weeks. Notes pure "black" blood, multiple episodes, pouring out. None in past week. Wife states he fell on his side in the carport about 3-5 weeks ago as well. Agrees with patient feeling weak, can barely get from living room to kitchen, starts to stagger. Can't fix his own food. +DOE, very weak. Wife states weight is going up. Used to weigh in the 180s. Wife states abdomen is larger than normal despite the hernia. Wife states he normally has an olive complexion, has not noted any  skin changes. Wife also states decreased po intake.   Past Medical History  Diagnosis Date  . CAD (coronary artery disease)     CABG 1999  . ICD (implantable cardiac defibrillator) in place     placed April 2007 secondary to left BBB, ischemic cariomyopathy, permanent afib   . PAD (peripheral artery disease)   . COPD (chronic obstructive pulmonary disease)   . Diabetes mellitus   . Hyperlipidemia   . Bladder cancer     s/p resection    Past Surgical History  Procedure Date  . Cardiac defibrillator placement     BiV ICD April 2007 by Dr. Alanda Amass  . Abdominal aortic aneurysm repair   . Cholecystectomy   . Right inguinal hernia   . Transurethral resection of bladder tumor     Current Outpatient Prescriptions  Medication Sig Dispense Refill  . carvedilol (COREG) 25 MG tablet Take 25 mg by mouth 2 (two) times daily with a meal.       . furosemide (LASIX) 40 MG tablet Take 40 mg by mouth daily.       [name] LANOXIN 0.25 MG tablet Take 0.25 mg by mouth daily.       [name] losartan (COZAAR) 50 MG tablet Take 50 mg by mouth daily.       . quinapril (ACCUPRIL) 40 MG tablet Take 40 mg by mouth daily.       [name] spironolactone (ALDACTONE) 25 MG tablet Take 25 mg by mouth daily.  Allergies as of 06/19/2012  . (No Known Allergies)    Family History  Problem Relation Age of Onset  . Colon cancer Sister     DECEASED 12-29-12metastatic, diagnosed in her late 53s.     History   Social History  . Marital Status: Married    Spouse Name: N/A    Number of Children: N/A  . Years of Education: N/A   Occupational History  . Not on file.   Social History Main Topics  . Smoking status: Current Every Day Smoker -- 1.0 packs/day    Types: Cigarettes  . Smokeless tobacco: Not on file  . Alcohol Use: No     Comment: remote hx of ETOH abuse in his 20s-30s  . Drug Use: No  . Sexually Active: Not on file   Other Topics Concern  . Not on file   Social History Narrative  . No  narrative on file    Review of Systems: Gen: +FATIGUE, WEAK CV: +DIZZY but better today Resp: Denies shortness of breath at rest or with exertion. Denies wheezing or cough.  GI: SEE HPI GU : Denies urinary burning, urinary frequency, urinary hesitancy MS: +LEG/HIP PAIN WITH EXERTION Derm: +dry skin Psych: +depression, wife states he gets confused Heme: SEE HPI  Physical Exam: BP 130/60  Pulse 68  Temp 97.2 F (36.2 C) (Oral)  Ht 6\' 2"  (1.88 m)  Wt 202 lb 6.4 oz (91.808 kg)  BMI 25.99 kg/m2 General:   Alert and oriented. Cachectic, ill-appearing.  Head:  Normocephalic and atraumatic. Eyes:  Without icterus, sclera clear and conjunctiva pink.  Ears:  HOH Nose:  No deformity, discharge,  or lesions. Mouth:  No deformity or lesions, oral mucosa pink.  Neck:  Supple, without mass or thyromegaly. Lungs:  Clear to auscultation bilaterally. No wheezes, rales, or rhonchi. No distress.  Heart:  S1, S2 present.  Abdomen:  Protuberant, obvious ventral hernia, soft, questionable ascites, no TTP, unable to assess definitely for HSM due to large AP diameter Rectal:  Deferred  Msk:  Symmetrical without gross deformities. Normal posture. Extremities:  2+ lower extremity edema  Neurologic:  Alert and  oriented x4;  grossly normal neurologically. Cervical Nodes:  No significant cervical adenopathy. Psych:  Alert and cooperative. Normal mood and affect.  LABS from 1/27:  Hgb 7.9 Hct 24.9 Plts 250  BUN 23 Cr 0.97 Na 135 Potassium 4.7 Alk Phos 130 AST 9 ALT 8 Albumin 3.1

## 2012-06-19 NOTE — Assessment & Plan Note (Signed)
72 year old male with microcytic anemia, Hgb 7.9 last week. Associated melena in the past few weeks. I spoke personally to his wife via telephone, as pt drove himself today. Orthostatic vitals taken, with BP maintaining in the 130s/60s. Pt is WEAK, FATIGUED, reports of falling. Wife notes a significant decline as stated in HPI. PT HAS BEEN ON COUMADIN CHRONICALLY FOR AFIB. His last dose was yesterday. I do not have an updated INR. He has not received a blood transfusion. No evidence of routine NSAIDs or aspirin powders. To my knowledge, he is not on a PPI.  Likely anemia secondary to acute blood loss from an UGI source. However, it is also concerning that his sister passed away from colon cancer. He has never had a colonoscopy.   I discussed at length with the patient the need for hospital admission due to his multiple health issues and significant anemia. He is adamantly refusing this, stating "everyone dies" when they are admitted. I spoke with his wife as well. She understands the acuity of this situation. I informed patient of the risk of death if he is not admitted due to acute GI bleed. He is well aware of this and still refuses.   He is agreeable to a blood transfusion. I reviewed these details with Dr. Darrick Penna.   Plan: Type and Cross for 2 units today, with plans to only infuse 1 unit on 1/29.  CBC, [facility] Vit K 10 mg IV X 1 after review of INR Transfuse 1u PRBCs on 1/29, orders faxed.  EGD with Dr. Darrick Penna on Friday, January 31.  Start Prilosec BID.   I have informed wife of this as well. Discussed signs and symptoms that would necessitate emergent medical evaluation.

## 2012-06-19 NOTE — Assessment & Plan Note (Signed)
Needs outpatient colonoscopy with Dr. Darrick Penna in the future. EGD first to assess for source of GI bleed. However, with pt being on Coumadin, unable to exclude source from anywhere in the GI tract. COUMADIN HAS BEEN HELD.

## 2012-06-19 NOTE — Patient Instructions (Addendum)
Go straight to the specialty clinic on the 4th floor of [facility]. You will have blood work done to see what blood type you are and what your blood count is.  TOMORROW, 1/29, you will have a blood transfusion.  Friday you are scheduled for an upper endoscopy to see where the blood loss is coming from.   If you have ANY signs of further black stool, abdominal pain, worsening weakness, light-headedness, throwing up blood, bright red blood from rectum, you need to go to the ED. Do not drive anymore by yourself.

## 2012-06-20 ENCOUNTER — Encounter (HOSPITAL_COMMUNITY): Payer: Medicare Other

## 2012-06-20 VITALS — BP 133/66 | HR 81 | Temp 97.9°F | Resp 18

## 2012-06-20 DIAGNOSIS — D649 Anemia, unspecified: Secondary | ICD-10-CM

## 2012-06-20 MED ORDER — DIPHENHYDRAMINE HCL 25 MG PO CAPS
ORAL_CAPSULE | ORAL | Status: AC
  Filled 2012-06-20: qty 1

## 2012-06-20 MED ORDER — ACETAMINOPHEN 325 MG PO TABS
650.0000 mg | ORAL_TABLET | Freq: Once | ORAL | Status: AC
  Administered 2012-06-20: 650 mg via ORAL

## 2012-06-20 MED ORDER — SODIUM CHLORIDE 0.9 % IV SOLN
250.0000 mL | Freq: Once | INTRAVENOUS | Status: DC
Start: 2012-06-20 — End: 2012-06-20

## 2012-06-20 MED ORDER — DIPHENHYDRAMINE HCL 25 MG PO CAPS
25.0000 mg | ORAL_CAPSULE | Freq: Once | ORAL | Status: AC
  Administered 2012-06-20: 25 mg via ORAL

## 2012-06-20 MED ORDER — ACETAMINOPHEN 325 MG PO TABS
ORAL_TABLET | ORAL | Status: AC
  Filled 2012-06-20: qty 2

## 2012-06-20 MED ORDER — SODIUM CHLORIDE 0.9 % IV SOLN
Freq: Once | INTRAVENOUS | Status: AC
  Administered 2012-06-20: 11:00:00 via INTRAVENOUS

## 2012-06-20 MED ORDER — SODIUM CHLORIDE 0.9 % IJ SOLN
10.0000 mL | INTRAMUSCULAR | Status: AC | PRN
  Administered 2012-06-20: 10 mL

## 2012-06-20 NOTE — Progress Notes (Signed)
Tolerated well

## 2012-06-22 ENCOUNTER — Encounter (HOSPITAL_COMMUNITY): Payer: Self-pay

## 2012-06-22 ENCOUNTER — Ambulatory Visit (HOSPITAL_COMMUNITY)
Admission: RE | Admit: 2012-06-22 | Discharge: 2012-06-22 | Disposition: A | Discharge status: Home / Self Care | Payer: Medicare Other | Source: Ambulatory Visit | Attending: Gastroenterology | Admitting: Gastroenterology

## 2012-06-22 ENCOUNTER — Ambulatory Visit (HOSPITAL_BASED_OUTPATIENT_CLINIC_OR_DEPARTMENT_OTHER): Payer: Medicare Other

## 2012-06-22 ENCOUNTER — Encounter (HOSPITAL_COMMUNITY)
Admission: RE | Disposition: A | Discharge status: Home / Self Care | Payer: Self-pay | Source: Ambulatory Visit | Attending: Gastroenterology

## 2012-06-22 VITALS — BP 126/68 | HR 70 | Temp 98.3°F | Resp 18

## 2012-06-22 DIAGNOSIS — K921 Melena: Secondary | ICD-10-CM

## 2012-06-22 DIAGNOSIS — K279 Peptic ulcer, site unspecified, unspecified as acute or chronic, without hemorrhage or perforation: Secondary | ICD-10-CM

## 2012-06-22 DIAGNOSIS — A048 Other specified bacterial intestinal infections: Secondary | ICD-10-CM | POA: Insufficient documentation

## 2012-06-22 DIAGNOSIS — D509 Iron deficiency anemia, unspecified: Secondary | ICD-10-CM | POA: Insufficient documentation

## 2012-06-22 DIAGNOSIS — D649 Anemia, unspecified: Secondary | ICD-10-CM

## 2012-06-22 DIAGNOSIS — E119 Type 2 diabetes mellitus without complications: Secondary | ICD-10-CM | POA: Insufficient documentation

## 2012-06-22 DIAGNOSIS — J449 Chronic obstructive pulmonary disease, unspecified: Secondary | ICD-10-CM | POA: Insufficient documentation

## 2012-06-22 DIAGNOSIS — K2980 Duodenitis without bleeding: Secondary | ICD-10-CM

## 2012-06-22 DIAGNOSIS — Z01812 Encounter for preprocedural laboratory examination: Secondary | ICD-10-CM | POA: Insufficient documentation

## 2012-06-22 DIAGNOSIS — K259 Gastric ulcer, unspecified as acute or chronic, without hemorrhage or perforation: Secondary | ICD-10-CM | POA: Insufficient documentation

## 2012-06-22 DIAGNOSIS — K2940 Chronic atrophic gastritis without bleeding: Secondary | ICD-10-CM | POA: Insufficient documentation

## 2012-06-22 DIAGNOSIS — J4489 Other specified chronic obstructive pulmonary disease: Secondary | ICD-10-CM | POA: Insufficient documentation

## 2012-06-22 HISTORY — PX: ESOPHAGOGASTRODUODENOSCOPY: SHX5428

## 2012-06-22 LAB — CBC
HCT: 26 % — ABNORMAL LOW (ref 39.0–52.0)
Hemoglobin: 8.4 g/dL — ABNORMAL LOW (ref 13.0–17.0)
MCH: 24.1 pg — ABNORMAL LOW (ref 26.0–34.0)
MCHC: 32.3 g/dL (ref 30.0–36.0)

## 2012-06-22 SURGERY — EGD (ESOPHAGOGASTRODUODENOSCOPY)
Anesthesia: Moderate Sedation

## 2012-06-22 MED ORDER — STERILE WATER FOR IRRIGATION IR SOLN
Status: DC | PRN
  Administered 2012-06-22: 12:00:00

## 2012-06-22 MED ORDER — MIDAZOLAM HCL 5 MG/5ML IJ SOLN
INTRAMUSCULAR | Status: AC
  Filled 2012-06-22: qty 10

## 2012-06-22 MED ORDER — SODIUM CHLORIDE 0.9 % IJ SOLN
10.0000 mL | INTRAMUSCULAR | Status: AC | PRN
  Administered 2012-06-22: 10 mL
  Filled 2012-06-22: qty 10

## 2012-06-22 MED ORDER — SODIUM CHLORIDE 0.9 % IV SOLN
250.0000 mL | Freq: Once | INTRAVENOUS | Status: AC
  Administered 2012-06-22: 250 mL via INTRAVENOUS

## 2012-06-22 MED ORDER — MIDAZOLAM HCL 5 MG/5ML IJ SOLN
INTRAMUSCULAR | Status: DC | PRN
  Administered 2012-06-22: 2 mg via INTRAVENOUS
  Administered 2012-06-22: 1 mg via INTRAVENOUS

## 2012-06-22 MED ORDER — MEPERIDINE HCL 100 MG/ML IJ SOLN
INTRAMUSCULAR | Status: AC
  Filled 2012-06-22: qty 2

## 2012-06-22 MED ORDER — BUTAMBEN-TETRACAINE-BENZOCAINE 2-2-14 % EX AERO
INHALATION_SPRAY | CUTANEOUS | Status: DC | PRN
  Administered 2012-06-22: 2 via TOPICAL

## 2012-06-22 MED ORDER — MEPERIDINE HCL 100 MG/ML IJ SOLN
INTRAMUSCULAR | Status: DC | PRN
  Administered 2012-06-22 (×2): 25 mg via INTRAVENOUS

## 2012-06-22 MED ORDER — SODIUM CHLORIDE 0.45 % IV SOLN
INTRAVENOUS | Status: DC
  Administered 2012-06-22: 11:00:00 via INTRAVENOUS

## 2012-06-22 NOTE — Progress Notes (Signed)
Tolerated infusion well. 

## 2012-06-22 NOTE — H&P (Signed)
  Primary Care Physician:  MCGOUGH,WILLIAM M, MD Primary Gastroenterologist:  Dr. Ferne Ellingwood  Pre-Procedure History & Physical: HPI:  Jose Mcgrath is a 71 y.o. male here for  MELENA.  Past Medical History  Diagnosis Date  . CAD (coronary artery disease)     CABG 1999  . ICD (implantable cardiac defibrillator) in place     placed April 2007 secondary to left BBB, ischemic cariomyopathy, permanent afib   . PAD (peripheral artery disease)   . COPD (chronic obstructive pulmonary disease)   . Diabetes mellitus   . Hyperlipidemia   . Bladder cancer     s/p resection    Past Surgical History  Procedure Date  . Cardiac defibrillator placement     BiV ICD April 2007 by Dr. Weintraub  . Abdominal aortic aneurysm repair   . Cholecystectomy   . Right inguinal hernia   . Transurethral resection of bladder tumor     Prior to Admission medications   Medication Sig Start Date End Date Taking? Authorizing Provider  carvedilol (COREG) 25 MG tablet Take 25 mg by mouth 2 (two) times daily with a meal.  05/21/12  Yes Historical Provider, MD  furosemide (LASIX) 40 MG tablet Take 40 mg by mouth daily.  06/14/12  Yes Historical Provider, MD  LANOXIN 0.25 MG tablet Take 0.25 mg by mouth daily.  05/21/12  Yes Historical Provider, MD  losartan (COZAAR) 50 MG tablet Take 50 mg by mouth daily.  06/14/12  Yes Historical Provider, MD  omeprazole (PRILOSEC) 20 MG capsule Take 1 capsule (20 mg total) by mouth 2 (two) times daily. 06/19/12  Yes Anna W Sams, NP  quinapril (ACCUPRIL) 40 MG tablet Take 40 mg by mouth daily.  05/21/12  Yes Historical Provider, MD  spironolactone (ALDACTONE) 25 MG tablet Take 25 mg by mouth daily.  05/24/12  Yes Historical Provider, MD    Allergies as of 06/19/2012  . (No Known Allergies)    Family History  Problem Relation Age of Onset  . Colon cancer Sister     DECEASED DEC 2012, metastatic, diagnosed in her late 60s.     History   Social History  . Marital Status:  Married    Spouse Name: N/A    Number of Children: N/A  . Years of Education: N/A   Occupational History  . Not on file.   Social History Main Topics  . Smoking status: Current Every Day Smoker -- 1.0 packs/day    Types: Cigarettes  . Smokeless tobacco: Not on file  . Alcohol Use: No     Comment: remote hx of ETOH abuse in his 20s-30s  . Drug Use: No  . Sexually Active: Not on file   Other Topics Concern  . Not on file   Social History Narrative  . No narrative on file    Review of Systems: See HPI, otherwise negative ROS   Physical Exam: BP 143/68  Pulse 81  Temp 97.7 F (36.5 C) (Oral)  Resp 20  SpO2 97% General:   Alert,  pleasant and cooperative in NAD Head:  Normocephalic and atraumatic. Neck:  Supple; Lungs:  Clear throughout to auscultation.    Heart:  Regular rate and rhythm. Abdomen:  Soft, nontender and nondistended. Normal bowel sounds, without guarding, and without rebound.   Neurologic:  Alert and  oriented x4;  grossly normal neurologically.  Impression/Plan:     MELENA  PLAN: 1. EGD TODAY   

## 2012-06-22 NOTE — Op Note (Signed)
[facility] [address] [place], [address]   ENDOSCOPY PROCEDURE REPORT  PATIENT: Jose Mcgrath, Jose Mcgrath  MR#: [id number] BIRTHDATE: April 24, 1941 , 71  yrs. old GENDER: Male  ENDOSCOPIST: Jonette Eva, MD REFERRED [id number] Regino Schultze, M.D.  Susa Griffins, M.D.  PROCEDURE DATE: 06/22/2012 PROCEDURE:   EGD w/ biopsy INDICATIONS:Melena. MEDICATIONS: Demerol 50 mg IV and Versed 3 mg IV TOPICAL ANESTHETIC:   Cetacaine Spray  DESCRIPTION OF PROCEDURE:     Physical exam was performed.  Informed consent was obtained from the patient after explaining the benefits, risks, and alternatives to the procedure.  The patient was connected to the monitor and placed in the left lateral position.  Continuous oxygen was provided by nasal cannula and IV medicine administered through an indwelling cannula.  After administration of sedation, the patients esophagus was intubated and the EG-[id number] ([id number])  endoscope was advanced under direct visualization to the second portion of the duodenum.  The scope was removed slowly by carefully examining the color, texture, anatomy, and integrity of the mucosa on the way out.  The patient was recovered in endoscopy and discharged home in satisfactory condition.   ESOPHAGUS:  NORMAL ESOPHAGUS. STOMACH: A small hiatal hernia was noted. Three irregular shaped, shallow and clean-based ulcers ranging between 3-73mm in size were found in the gastric antrum. ONE WITH EVIDENCE OF RECEBT BLEEDING BUT NO VISIBLE VESSEL. Biopsies were taken around the ulcers.  DUODENUM: Three irregular shaped and shallow ulcers ranging between 3-5 mm in size with surrounding edema were found in the duodenal bulb.   Moderate duodenal inflammation was found in the 2nd part of the duodenum.  COMPLICATIONS:   None  ENDOSCOPIC IMPRESSION: 1.   Small hiatal hernia 2.   PEPTIC ULCER DISEASE & MODERATE DUODENITIS-THE MOST LIKELY REASON FOR ANEMIA/MELENA IN THE SETTING OF  COUMADIN.  RECOMMENDATIONS: ONE UNIT OF BLOOD TODAY.  CHECK POST-TRANSFUSION CBC. RESTART YOUR COUMADIN ON FEB 6.  CONTACT THE COUMADIN CLINIC TO HAVE YOUR INR CHECKED ON FEB 10. CONSIDER COLONOSCOPY WITHIN THE NEXT 2 WEEKS WHILE COUMADIN IS ON HOLD. CONTINUE OMEPRAZOLE TWICE DAILY. BIOPSIES SHOULD BE BACK IN 7 DAYS. REPEAT EGD IN 3 MOS TO ASESS HEALING. FOLLOW UP IN 6 MOS IN THE OFFICE.   REPEAT EXAM:   _______________________________ Rosalie DoctorJonette Eva, MD 06/22/2012 1:29 PM       PATIENT NAME:  Jose Mcgrath MR#: [id number]

## 2012-06-23 LAB — TYPE AND SCREEN
ABO/RH(D): A NEG
Unit division: 0

## 2012-06-25 ENCOUNTER — Encounter (HOSPITAL_COMMUNITY): Payer: Self-pay | Admitting: Pharmacy Technician

## 2012-06-25 ENCOUNTER — Other Ambulatory Visit: Payer: Self-pay | Admitting: Gastroenterology

## 2012-06-25 DIAGNOSIS — D649 Anemia, unspecified: Secondary | ICD-10-CM

## 2012-06-25 DIAGNOSIS — Z800 Family history of malignant neoplasm of digestive organs: Secondary | ICD-10-CM

## 2012-06-25 MED ORDER — PEG 3350-KCL-NA BICARB-NACL 420 G PO SOLR: 4000.0000 mL | ORAL | Status: DC

## 2012-06-27 ENCOUNTER — Telehealth: Payer: Self-pay | Admitting: Gastroenterology

## 2012-06-27 NOTE — Telephone Encounter (Signed)
DISCUSSED MANAGEMENT WITH [name].

## 2012-06-27 NOTE — Telephone Encounter (Signed)
Path faxed to PCP, recalls made 

## 2012-06-27 NOTE — Telephone Encounter (Signed)
CALLED PT, BUT SPOKE WITH HIS WIFE. COUMADIN STILL ON HOLD FOR TCS FEB 7. EXPLAINED HIS stomach Bx showed H. Pylori infection & THAT IS WHY HE HAS ULCERS IN HIS STOMACH AND SMALL BOWEL.He needs AMOXICILLIN 500 mg 2 po BID for 10 days and Biaxin 500 mg po bid for 10 days. CONTINUE OMEPRAZOLE BID. Med side effects include NVD, abd pain, and metallic taste. PT WILL START ABX ON FRI AND WILL NEED DIGOXIN LEVEL CHECK ED ON MON. WILL MAKE REC'S FOR COUMADIN AFTER TCS COMPLETE.  REPEAT EGD IN 3 MOS TO MAKE SURE YOUR ULCERS ARE HEALED.   FOLLOW UP IN 6 MOS IN THE OFFICE.

## 2012-06-29 ENCOUNTER — Encounter (HOSPITAL_COMMUNITY)
Admission: RE | Disposition: A | Discharge status: Home / Self Care | Payer: Self-pay | Source: Ambulatory Visit | Attending: Gastroenterology

## 2012-06-29 ENCOUNTER — Ambulatory Visit (HOSPITAL_COMMUNITY)
Admission: RE | Admit: 2012-06-29 | Discharge: 2012-06-29 | Disposition: A | Discharge status: Home / Self Care | Payer: Medicare Other | Source: Ambulatory Visit | Attending: Gastroenterology | Admitting: Gastroenterology

## 2012-06-29 ENCOUNTER — Encounter (HOSPITAL_COMMUNITY): Payer: Self-pay | Admitting: *Deleted

## 2012-06-29 DIAGNOSIS — D129 Benign neoplasm of anus and anal canal: Secondary | ICD-10-CM | POA: Insufficient documentation

## 2012-06-29 DIAGNOSIS — D126 Benign neoplasm of colon, unspecified: Secondary | ICD-10-CM | POA: Insufficient documentation

## 2012-06-29 DIAGNOSIS — K921 Melena: Secondary | ICD-10-CM | POA: Insufficient documentation

## 2012-06-29 DIAGNOSIS — Z800 Family history of malignant neoplasm of digestive organs: Secondary | ICD-10-CM | POA: Insufficient documentation

## 2012-06-29 DIAGNOSIS — K5730 Diverticulosis of large intestine without perforation or abscess without bleeding: Secondary | ICD-10-CM | POA: Insufficient documentation

## 2012-06-29 DIAGNOSIS — D128 Benign neoplasm of rectum: Secondary | ICD-10-CM | POA: Insufficient documentation

## 2012-06-29 DIAGNOSIS — D509 Iron deficiency anemia, unspecified: Secondary | ICD-10-CM | POA: Insufficient documentation

## 2012-06-29 DIAGNOSIS — Q2733 Arteriovenous malformation of digestive system vessel: Secondary | ICD-10-CM | POA: Insufficient documentation

## 2012-06-29 DIAGNOSIS — D649 Anemia, unspecified: Secondary | ICD-10-CM

## 2012-06-29 DIAGNOSIS — K5520 Angiodysplasia of colon without hemorrhage: Secondary | ICD-10-CM

## 2012-06-29 DIAGNOSIS — K648 Other hemorrhoids: Secondary | ICD-10-CM | POA: Insufficient documentation

## 2012-06-29 HISTORY — PX: COLONOSCOPY: SHX5424

## 2012-06-29 SURGERY — COLONOSCOPY
Anesthesia: Moderate Sedation

## 2012-06-29 MED ORDER — MIDAZOLAM HCL 5 MG/5ML IJ SOLN
INTRAMUSCULAR | Status: AC
  Filled 2012-06-29: qty 10

## 2012-06-29 MED ORDER — STERILE WATER FOR IRRIGATION IR SOLN
Status: DC | PRN
  Administered 2012-06-29: 11:00:00

## 2012-06-29 MED ORDER — SODIUM CHLORIDE 0.45 % IV SOLN
INTRAVENOUS | Status: DC
  Administered 2012-06-29: 1000 mL via INTRAVENOUS

## 2012-06-29 MED ORDER — MIDAZOLAM HCL 5 MG/5ML IJ SOLN
INTRAMUSCULAR | Status: DC | PRN
  Administered 2012-06-29: 1 mg via INTRAVENOUS
  Administered 2012-06-29: 2 mg via INTRAVENOUS
  Administered 2012-06-29 (×2): 1 mg via INTRAVENOUS

## 2012-06-29 MED ORDER — MEPERIDINE HCL 100 MG/ML IJ SOLN
INTRAMUSCULAR | Status: AC
  Filled 2012-06-29: qty 1

## 2012-06-29 MED ORDER — MEPERIDINE HCL 100 MG/ML IJ SOLN
INTRAMUSCULAR | Status: DC | PRN
  Administered 2012-06-29: 25 mg via INTRAVENOUS

## 2012-06-29 NOTE — H&P (View-Only) (Signed)
  Primary Care Physician:  Kirk Ruths, MD Primary Gastroenterologist:  Dr. Darrick Penna  Pre-Procedure History & Physical: HPI:  Jose Mcgrath is a 72 y.o. male here for  MELENA.  Past Medical History  Diagnosis Date  . CAD (coronary artery disease)     CABG 1999  . ICD (implantable cardiac defibrillator) in place     placed April 2007 secondary to left BBB, ischemic cariomyopathy, permanent afib   . PAD (peripheral artery disease)   . COPD (chronic obstructive pulmonary disease)   . Diabetes mellitus   . Hyperlipidemia   . Bladder cancer     s/p resection    Past Surgical History  Procedure Date  . Cardiac defibrillator placement     BiV ICD April 2007 by Dr. Alanda Amass  . Abdominal aortic aneurysm repair   . Cholecystectomy   . Right inguinal hernia   . Transurethral resection of bladder tumor     Prior to Admission medications   Medication Sig Start Date End Date Taking? Authorizing Provider  carvedilol (COREG) 25 MG tablet Take 25 mg by mouth 2 (two) times daily with a meal.  05/21/12  Yes Historical Provider, MD  furosemide (LASIX) 40 MG tablet Take 40 mg by mouth daily.  06/14/12  Yes Historical Provider, MD  LANOXIN 0.25 MG tablet Take 0.25 mg by mouth daily.  05/21/12  Yes Historical Provider, MD  losartan (COZAAR) 50 MG tablet Take 50 mg by mouth daily.  06/14/12  Yes Historical Provider, MD  omeprazole (PRILOSEC) 20 MG capsule Take 1 capsule (20 mg total) by mouth 2 (two) times daily. 06/19/12  Yes Nira Retort, NP  quinapril (ACCUPRIL) 40 MG tablet Take 40 mg by mouth daily.  05/21/12  Yes Historical Provider, MD  spironolactone (ALDACTONE) 25 MG tablet Take 25 mg by mouth daily.  05/24/12  Yes Historical Provider, MD    Allergies as of 06/19/2012  . (No Known Allergies)    Family History  Problem Relation Age of Onset  . Colon cancer Sister     DECEASED Jan 10, 2013metastatic, diagnosed in her late 36s.     History   Social History  . Marital Status:  Married    Spouse Name: N/A    Number of Children: N/A  . Years of Education: N/A   Occupational History  . Not on file.   Social History Main Topics  . Smoking status: Current Every Day Smoker -- 1.0 packs/day    Types: Cigarettes  . Smokeless tobacco: Not on file  . Alcohol Use: No     Comment: remote hx of ETOH abuse in his 20s-30s  . Drug Use: No  . Sexually Active: Not on file   Other Topics Concern  . Not on file   Social History Narrative  . No narrative on file    Review of Systems: See HPI, otherwise negative ROS   Physical Exam: BP 143/68  Pulse 81  Temp 97.7 F (36.5 C) (Oral)  Resp 20  SpO2 97% General:   Alert,  pleasant and cooperative in NAD Head:  Normocephalic and atraumatic. Neck:  Supple; Lungs:  Clear throughout to auscultation.    Heart:  Regular rate and rhythm. Abdomen:  Soft, nontender and nondistended. Normal bowel sounds, without guarding, and without rebound.   Neurologic:  Alert and  oriented x4;  grossly normal neurologically.  Impression/Plan:     MELENA  PLAN: 1. EGD TODAY

## 2012-06-29 NOTE — Op Note (Addendum)
[facility] [address] [place], [address]   COLONOSCOPY PROCEDURE REPORT  PATIENT: Jose Mcgrath, Jose Mcgrath  MR#: [id number] BIRTHDATE: 1940-05-31 , 71  yrs. old GENDER: Male ENDOSCOPIST: Jonette Eva, MD REFERRED [id number] Regino Schultze, M.D.  Susa Griffins, M.D. PROCEDURE DATE:  06/29/2012 PROCEDURE:   Colonoscopy with biopsy and snare polypectomy and Submucosal injection of SPOT INDICATIONS:MELENA/ANEMIA and Patient's immediate family history of colon cancer. MEDICATIONS: Demerol 25 mg IV and Versed 5 mg IV  DESCRIPTION OF PROCEDURE:    Physical exam was performed.  Informed consent was obtained from the patient after explaining the benefits, risks, and alternatives to procedure.  The patient was connected to monitor and placed in left lateral position. Continuous oxygen was provided by nasal cannula and IV medicine administered through an indwelling cannula.  After administration of sedation and rectal exam, the patients rectum was intubated and the EC-[id number] ([id number])  colonoscope was advanced under direct visualization to the ileum.  The scope was removed slowly by carefully examining the color, texture, anatomy, and integrity mucosa on the way out.  The patient was recovered in endoscopy and discharged home in satisfactory condition.    COLON FINDINGS: Multiple smooth sessile polyps measuring 4-15 mm in size were found at the cecum, in the ascending colon, transverse colon, at the splenic flexure, in the descending colon, sigmoid colon, and rectum.  A polypectomy was performed with cold forceps and using snare cautery(20-25W). POLYP SITE IN THE PROXIMAL TRANSVERSE COLON AND THE SPLENIC FLEXURE WERE TATTOOED WITH 1 CC SPOT. The resection was complete and the polyp tissue was completely retrieved, Mild diverticulosis was noted in the descending colon and sigmoid colon.  , FOUR COLON AVMS IN THE ASCENDING AND TRANSVERSE COLON ABLATED USING BICAP(25  W)Moderate sized internal hemorrhoids were found.  , and The mucosa appeared normal in the terminal ileum.  PREP QUALITY: good. CECAL W/D TIME:       1 HOUR 49 MINS  COMPLICATIONS: None  ENDOSCOPIC IMPRESSION: 1.   ANEMIA, MULTIFACTORIAL- 41 COLON POLYPS, COLON AVMS, AND PUD 2.   Mild diverticulosis was noted in the descending colon and sigmoid colon 3.   Moderate sized internal hemorrhoids  RECOMMENDATIONS: RESTART COUMADIN ON FEB 14.  HAVE YOUR INR CHECKED ON FEB 17. FOLLOW A HIGH FIBER DIET.  AVOID ITEMS THAT CAUSE BLOATING. BIOPSY RESULTS SHOULD BE BACK IN 7 DAYS. Next colonoscopy in 1 YEAR IF ADVANCED POLYPS & 3 years IF SIMPLE ADENOMAS.       _______________________________ Rosalie DoctorJonette Eva, MD 06/29/2012 3:34 PM Revised: 06/29/2012 3:34 PM    PATIENT NAME:  Jose Mcgrath MR#: [id number]

## 2012-06-29 NOTE — Interval H&P Note (Signed)
History and Physical Interval Note:  06/29/2012 10:19 AM  Jose Mcgrath  has presented today for surgery, with the diagnosis of ANEMIA  AND FAM HX OF COLON CA  The various methods of treatment have been discussed with the patient and family. After consideration of risks, benefits and other options for treatment, the patient has consented to  Procedure(s) (LRB) with comments: COLONOSCOPY (N/A) - 10:30 as a surgical intervention .  The patient's history has been reviewed, patient examined, no change in status, stable for surgery.  I have reviewed the patient's chart and labs.  Questions were answered to the patient's satisfaction.     [employer]

## 2012-07-03 ENCOUNTER — Encounter (HOSPITAL_COMMUNITY): Payer: Self-pay | Admitting: Gastroenterology

## 2012-07-09 ENCOUNTER — Other Ambulatory Visit: Payer: Self-pay

## 2012-07-09 ENCOUNTER — Inpatient Hospital Stay (HOSPITAL_COMMUNITY)
Admission: EM | Admit: 2012-07-09 | Discharge: 2012-07-11 | DRG: 378 | Disposition: A | Discharge status: Home / Self Care | Payer: Medicare Other | Attending: Internal Medicine | Admitting: Internal Medicine

## 2012-07-09 ENCOUNTER — Telehealth: Payer: Self-pay

## 2012-07-09 ENCOUNTER — Emergency Department (HOSPITAL_COMMUNITY): Payer: Medicare Other

## 2012-07-09 ENCOUNTER — Encounter (HOSPITAL_COMMUNITY): Payer: Self-pay | Admitting: Emergency Medicine

## 2012-07-09 DIAGNOSIS — Z951 Presence of aortocoronary bypass graft: Secondary | ICD-10-CM

## 2012-07-09 DIAGNOSIS — J449 Chronic obstructive pulmonary disease, unspecified: Secondary | ICD-10-CM

## 2012-07-09 DIAGNOSIS — C679 Malignant neoplasm of bladder, unspecified: Secondary | ICD-10-CM

## 2012-07-09 DIAGNOSIS — F17200 Nicotine dependence, unspecified, uncomplicated: Secondary | ICD-10-CM | POA: Diagnosis present

## 2012-07-09 DIAGNOSIS — I482 Chronic atrial fibrillation, unspecified: Secondary | ICD-10-CM

## 2012-07-09 DIAGNOSIS — Z79899 Other long term (current) drug therapy: Secondary | ICD-10-CM

## 2012-07-09 DIAGNOSIS — I714 Abdominal aortic aneurysm, without rupture, unspecified: Secondary | ICD-10-CM | POA: Diagnosis present

## 2012-07-09 DIAGNOSIS — K5520 Angiodysplasia of colon without hemorrhage: Secondary | ICD-10-CM | POA: Diagnosis present

## 2012-07-09 DIAGNOSIS — Z7901 Long term (current) use of anticoagulants: Secondary | ICD-10-CM

## 2012-07-09 DIAGNOSIS — E119 Type 2 diabetes mellitus without complications: Secondary | ICD-10-CM | POA: Diagnosis present

## 2012-07-09 DIAGNOSIS — K635 Polyp of colon: Secondary | ICD-10-CM | POA: Diagnosis present

## 2012-07-09 DIAGNOSIS — I739 Peripheral vascular disease, unspecified: Secondary | ICD-10-CM | POA: Diagnosis present

## 2012-07-09 DIAGNOSIS — J4489 Other specified chronic obstructive pulmonary disease: Secondary | ICD-10-CM | POA: Diagnosis present

## 2012-07-09 DIAGNOSIS — I2510 Atherosclerotic heart disease of native coronary artery without angina pectoris: Secondary | ICD-10-CM | POA: Diagnosis present

## 2012-07-09 DIAGNOSIS — I509 Heart failure, unspecified: Secondary | ICD-10-CM

## 2012-07-09 DIAGNOSIS — D62 Acute posthemorrhagic anemia: Secondary | ICD-10-CM | POA: Diagnosis present

## 2012-07-09 DIAGNOSIS — K922 Gastrointestinal hemorrhage, unspecified: Principal | ICD-10-CM | POA: Diagnosis present

## 2012-07-09 DIAGNOSIS — Z23 Encounter for immunization: Secondary | ICD-10-CM

## 2012-07-09 DIAGNOSIS — D649 Anemia, unspecified: Secondary | ICD-10-CM

## 2012-07-09 DIAGNOSIS — D509 Iron deficiency anemia, unspecified: Secondary | ICD-10-CM | POA: Diagnosis present

## 2012-07-09 DIAGNOSIS — Z95810 Presence of automatic (implantable) cardiac defibrillator: Secondary | ICD-10-CM

## 2012-07-09 DIAGNOSIS — Z66 Do not resuscitate: Secondary | ICD-10-CM | POA: Diagnosis present

## 2012-07-09 DIAGNOSIS — I4891 Unspecified atrial fibrillation: Secondary | ICD-10-CM | POA: Diagnosis present

## 2012-07-09 DIAGNOSIS — Z950 Presence of cardiac pacemaker: Secondary | ICD-10-CM

## 2012-07-09 DIAGNOSIS — Z5989 Other problems related to housing and economic circumstances: Secondary | ICD-10-CM

## 2012-07-09 DIAGNOSIS — E785 Hyperlipidemia, unspecified: Secondary | ICD-10-CM | POA: Diagnosis present

## 2012-07-09 DIAGNOSIS — Z598 Other problems related to housing and economic circumstances: Secondary | ICD-10-CM

## 2012-07-09 DIAGNOSIS — Z8551 Personal history of malignant neoplasm of bladder: Secondary | ICD-10-CM

## 2012-07-09 DIAGNOSIS — K279 Peptic ulcer, site unspecified, unspecified as acute or chronic, without hemorrhage or perforation: Secondary | ICD-10-CM

## 2012-07-09 LAB — COMPREHENSIVE METABOLIC PANEL
AST: 11 U/L (ref 0–37)
BUN: 38 mg/dL — ABNORMAL HIGH (ref 6–23)
CO2: 26 mEq/L (ref 19–32)
Calcium: 8.8 mg/dL (ref 8.4–10.5)
Creatinine, Ser: 1.21 mg/dL (ref 0.50–1.35)
GFR calc Af Amer: 69 mL/min — ABNORMAL LOW (ref >90)
GFR calc non Af Amer: 59 mL/min — ABNORMAL LOW (ref >90)
Glucose, Bld: 122 mg/dL — ABNORMAL HIGH (ref 70–99)
Total Bilirubin: 0.6 mg/dL (ref 0.3–1.2)

## 2012-07-09 LAB — CBC WITH DIFFERENTIAL/PLATELET
Basophils Absolute: 0.1 10*3/uL (ref 0.0–0.1)
Eosinophils Relative: 3 % (ref 0–5)
HCT: 22.6 % — ABNORMAL LOW (ref 39.0–52.0)
Hemoglobin: 7.4 g/dL — ABNORMAL LOW (ref 13.0–17.0)
Lymphocytes Relative: 20 % (ref 12–46)
MCV: 71.5 fL — ABNORMAL LOW (ref 78.0–100.0)
Monocytes Absolute: 0.8 10*3/uL (ref 0.1–1.0)
Monocytes Relative: 14 % — ABNORMAL HIGH (ref 3–12)
RDW: 17.7 % — ABNORMAL HIGH (ref 11.5–15.5)
WBC: 5.6 10*3/uL (ref 4.0–10.5)

## 2012-07-09 LAB — PROTIME-INR
INR: 1.37 (ref 0.00–1.49)
Prothrombin Time: 16.5 seconds — ABNORMAL HIGH (ref 11.6–15.2)

## 2012-07-09 LAB — TROPONIN I: Troponin I: <0.3 ng/mL (ref <0.30)

## 2012-07-09 MED ORDER — SODIUM CHLORIDE 0.9 % IJ SOLN
3.0000 mL | Freq: Two times a day (BID) | INTRAMUSCULAR | Status: DC
  Administered 2012-07-09 – 2012-07-10 (×3): 3 mL via INTRAVENOUS

## 2012-07-09 MED ORDER — ALBUTEROL SULFATE (5 MG/ML) 0.5% IN NEBU
2.5000 mg | INHALATION_SOLUTION | Freq: Four times a day (QID) | RESPIRATORY_TRACT | Status: DC
  Administered 2012-07-10 (×4): 2.5 mg via RESPIRATORY_TRACT
  Filled 2012-07-09 (×4): qty 0.5

## 2012-07-09 MED ORDER — ONDANSETRON HCL 4 MG/2ML IJ SOLN: 4.0000 mg | Freq: Four times a day (QID) | INTRAMUSCULAR | Status: DC | PRN

## 2012-07-09 MED ORDER — SODIUM CHLORIDE 0.9 % IV SOLN
INTRAVENOUS | Status: AC
  Administered 2012-07-09: 22:00:00 via INTRAVENOUS

## 2012-07-09 MED ORDER — ALBUTEROL SULFATE (5 MG/ML) 0.5% IN NEBU: 2.5000 mg | INHALATION_SOLUTION | RESPIRATORY_TRACT | Status: DC | PRN

## 2012-07-09 MED ORDER — PNEUMOCOCCAL VAC POLYVALENT 25 MCG/0.5ML IJ INJ
0.5000 mL | INJECTION | INTRAMUSCULAR | Status: AC
  Administered 2012-07-10: 0.5 mL via INTRAMUSCULAR
  Filled 2012-07-09: qty 0.5

## 2012-07-09 MED ORDER — ONDANSETRON HCL 4 MG PO TABS: 4.0000 mg | ORAL_TABLET | Freq: Four times a day (QID) | ORAL | Status: DC | PRN

## 2012-07-09 MED ORDER — ACETAMINOPHEN 325 MG PO TABS: 650.0000 mg | ORAL_TABLET | Freq: Four times a day (QID) | ORAL | Status: DC | PRN

## 2012-07-09 MED ORDER — DIGOXIN 250 MCG PO TABS
0.2500 mg | ORAL_TABLET | Freq: Every morning | ORAL | Status: DC
  Administered 2012-07-10: 0.25 mg via ORAL
  Filled 2012-07-09: qty 1

## 2012-07-09 MED ORDER — CARVEDILOL 12.5 MG PO TABS
25.0000 mg | ORAL_TABLET | Freq: Two times a day (BID) | ORAL | Status: DC
  Administered 2012-07-09: 25 mg via ORAL
  Filled 2012-07-09: qty 2

## 2012-07-09 MED ORDER — CAMPHOR-MENTHOL 0.5-0.5 % EX LOTN
1.0000 "application " | TOPICAL_LOTION | Freq: Four times a day (QID) | CUTANEOUS | Status: DC | PRN
  Filled 2012-07-09: qty 222

## 2012-07-09 MED ORDER — MORPHINE SULFATE 2 MG/ML IJ SOLN: 1.0000 mg | INTRAMUSCULAR | Status: DC | PRN

## 2012-07-09 MED ORDER — PANTOPRAZOLE SODIUM 40 MG IV SOLR
INTRAVENOUS | Status: AC
  Filled 2012-07-09: qty 80

## 2012-07-09 MED ORDER — TRIAMCINOLONE ACETONIDE 0.5 % EX CREA
TOPICAL_CREAM | Freq: Two times a day (BID) | CUTANEOUS | Status: DC
  Administered 2012-07-10 (×3): via TOPICAL
  Filled 2012-07-09: qty 15

## 2012-07-09 MED ORDER — FUROSEMIDE 10 MG/ML IJ SOLN: 20.0000 mg | Freq: Once | INTRAMUSCULAR | Status: DC

## 2012-07-09 MED ORDER — PANTOPRAZOLE SODIUM 40 MG IV SOLR
8.0000 mg/h | INTRAVENOUS | Status: AC
  Administered 2012-07-09: 8 mg/h via INTRAVENOUS
  Filled 2012-07-09 (×2): qty 80

## 2012-07-09 MED ORDER — ACETAMINOPHEN 650 MG RE SUPP: 650.0000 mg | Freq: Four times a day (QID) | RECTAL | Status: DC | PRN

## 2012-07-09 NOTE — Telephone Encounter (Signed)
PT's wife called and said the pt had his colonoscopy on 06/29/2012. He just had some rectal bleeding today and it was quite a bit. She said he is very weak and dizzy and having vision problems. He was told to start his coumadin on 07/06/2012 and he started it back on 07/07/2012. He had taken it today before he had the bleeding.  I told her it sounds like he needs to go to the ED but I will page Dr. Darrick Penna and ask her recommendation. Sending a pager message.

## 2012-07-09 NOTE — ED Provider Notes (Signed)
History    This chart was scribed for Jose Crease, MD by Gerlean Ren, ED Scribe. This patient was seen in room APA04/[facility]04 and the patient's care was started at 5:56 PM    CSN: [id number]  Arrival date & time 07/09/12  1720   First MD Initiated Contact with Patient 07/09/12 1748      Chief Complaint  Patient presents with  . Rectal Bleeding  . Fatigue     The history is provided by the patient. No language interpreter was used.  Jose Mcgrath is a 72 y.o. male with h/o CAD, PAD, COPD, DM brought in by ambulance to the Emergency Department complaining of rectal bleeding first noticed today.  Pt had endoscopy performed 2 weeks ago and colonoscopy performed one week ago and had 41 polyps removed and "2 red spots burnt off."  Both performed for similar rectal bleeding.  Pt reports associated generalized weakness that prevented pt from standing long enough to shower today.  Pt denies abdominal pain, chest pain, dyspnea, dizziness.  Pt is a current everyday smoker and denies alcohol use.  Past Medical History  Diagnosis Date  . CAD (coronary artery disease)     CABG 1999  . ICD (implantable cardiac defibrillator) in place     placed April 2007 secondary to left BBB, ischemic cariomyopathy, permanent afib   . PAD (peripheral artery disease)   . COPD (chronic obstructive pulmonary disease)   . Hyperlipidemia   . Bladder cancer     s/p resection  . Diabetes mellitus     patient says his not a diabetic    Past Surgical History  Procedure Laterality Date  . Cardiac defibrillator placement      BiV ICD April 2007 by Dr. Alanda Amass  . Abdominal aortic aneurysm repair    . Cholecystectomy    . Right inguinal hernia    . Transurethral resection of bladder tumor    . Esophagogastroduodenoscopy  06/22/2012    Procedure: ESOPHAGOGASTRODUODENOSCOPY (EGD);  Surgeon: West Bali, MD;  Location: [facility];  Service: Endoscopy;  Laterality: N/A;  11:30 AM  . Colonoscopy N/A  06/29/2012    Procedure: COLONOSCOPY;  Surgeon: West Bali, MD;  Location: [facility];  Service: Endoscopy;  Laterality: N/A;  10:30    Family History  Problem Relation Age of Onset  . Colon cancer Sister     DECEASED 2013-01-03metastatic, diagnosed in her late 14s.     History  Substance Use Topics  . Smoking status: Current Every Day Smoker -- 1.00 packs/day    Types: Cigarettes  . Smokeless tobacco: Not on file  . Alcohol Use: No     Comment: remote hx of ETOH abuse in his 20s-30s      Review of Systems  Respiratory: Negative for shortness of breath.   Cardiovascular: Negative for chest pain.  Gastrointestinal: Positive for anal bleeding. Negative for vomiting and abdominal pain.  Neurological: Positive for weakness. Negative for dizziness.  All other systems reviewed and are negative.    Allergies  Review of patient's allergies indicates no known allergies.  Home Medications   Current Outpatient Rx  Name  Route  Sig  Dispense  Refill  . carvedilol (COREG) 25 MG tablet   Oral   Take 25 mg by mouth 2 (two) times daily with a meal.          . furosemide (LASIX) 40 MG tablet   Oral   Take 40 mg by  mouth daily.          [name] LANOXIN 0.25 MG tablet   Oral   Take 0.25 mg by mouth daily.          [name] losartan (COZAAR) 50 MG tablet   Oral   Take 50 mg by mouth daily.          [name] omeprazole (PRILOSEC) 20 MG capsule   Oral   Take 1 capsule (20 mg total) by mouth 2 (two) times daily.   60 capsule   3   . quinapril (ACCUPRIL) 40 MG tablet   Oral   Take 40 mg by mouth daily.          [name] spironolactone (ALDACTONE) 25 MG tablet   Oral   Take 25 mg by mouth daily.            BP 113/47  Pulse 74  Temp(Src) 97.2 F (36.2 C) (Oral)  Resp 18  Ht 6\' 2"  (1.88 m)  Wt 190 lb (86.183 kg)  BMI 24.38 kg/m2  SpO2 97%  Physical Exam  Nursing note and vitals reviewed. Constitutional: He is oriented to person, place, and time. He appears well-developed  and well-nourished. No distress.  HENT:  Head: Normocephalic and atraumatic.  Eyes: EOM are normal.  Neck: Neck supple.  Cardiovascular: Normal rate, regular rhythm and normal heart sounds.   No murmur heard. Pulmonary/Chest: Effort normal and breath sounds normal. No respiratory distress. He has no wheezes.  Musculoskeletal: Normal range of motion.  Neurological: He is alert and oriented to person, place, and time.  Skin: Skin is warm and dry.  Psychiatric: He has a normal mood and affect. His behavior is normal.    ED Course  Procedures (including critical care time) DIAGNOSTIC STUDIES: Oxygen Saturation is 97% on room air, adequate by my interpretation.    EKG: V-pacing  COORDINATION OF CARE: 5:59 PM- Patient informed of clinical course including CBC, c-met, troponin, protime-INR, APTT, and abdominal chest XR, understands medical decision-making process, and agrees with plan.  Labs Reviewed  CBC WITH DIFFERENTIAL - Abnormal; Notable for the following:    RBC 3.16 (*)    Hemoglobin 7.4 (*)    HCT 22.6 (*)    MCV 71.5 (*)    MCH 23.4 (*)    RDW 17.7 (*)    Monocytes Relative 14 (*)    All other components within normal limits  COMPREHENSIVE METABOLIC PANEL - Abnormal; Notable for the following:    Sodium 131 (*)    Chloride 95 (*)    Glucose, Bld 122 (*)    BUN 38 (*)    Albumin 3.3 (*)    Alkaline Phosphatase 140 (*)    GFR calc non Af Amer 59 (*)    GFR calc Af Amer 69 (*)    All other components within normal limits  PROTIME-INR - Abnormal; Notable for the following:    Prothrombin Time 16.5 (*)    All other components within normal limits  TROPONIN I  APTT  TYPE AND SCREEN   No results found.   Diagnosis: Lower GI bleed with anemia    MDM  Patient presents to the ER with complaints of GI bleed. Patient has been passing bright red blood per rectum. Patient just underwent colonoscopy with removal of multiple polyps. Patient has now started having  bleeding. He is not hemodynamically unstable but his anemia. He'll require hospitalization for further management.  I personally performed the services described in this documentation,  which was scribed in my presence. The recorded information has been reviewed and is accurate.         Jose Crease, MD 07/09/12 [phone]

## 2012-07-09 NOTE — ED Notes (Signed)
Patient brought in via EMS. Alert and oriented. Airway patent.Patient c/o rectal bleeding and generalized weakness. Per patient had endoscopy done x2 weeks ago and colonoscopy a week ago. Patient reports 41 polyps removed and "2 red spots burnt off." Patient reports lower abd cramping and bloating. Per patient had BM after lunch and noticed dark red blood with wiping. Patient jaundice in color.

## 2012-07-09 NOTE — Telephone Encounter (Signed)
Called and informed pt's wife. She said she will call them now.

## 2012-07-09 NOTE — Telephone Encounter (Signed)
PLEASE CALL PT.  HE SHOULD CALL 911 FOR AMBULANCE TRANSPORT TO THE ED.

## 2012-07-10 ENCOUNTER — Encounter (HOSPITAL_COMMUNITY): Payer: Self-pay | Admitting: Gastroenterology

## 2012-07-10 DIAGNOSIS — I4891 Unspecified atrial fibrillation: Secondary | ICD-10-CM

## 2012-07-10 DIAGNOSIS — I2510 Atherosclerotic heart disease of native coronary artery without angina pectoris: Secondary | ICD-10-CM

## 2012-07-10 DIAGNOSIS — J449 Chronic obstructive pulmonary disease, unspecified: Secondary | ICD-10-CM

## 2012-07-10 DIAGNOSIS — E119 Type 2 diabetes mellitus without complications: Secondary | ICD-10-CM

## 2012-07-10 DIAGNOSIS — D649 Anemia, unspecified: Secondary | ICD-10-CM

## 2012-07-10 DIAGNOSIS — K625 Hemorrhage of anus and rectum: Secondary | ICD-10-CM

## 2012-07-10 LAB — OCCULT BLOOD, POC DEVICE: Fecal Occult Bld: POSITIVE — AB

## 2012-07-10 LAB — CBC
Hemoglobin: 6 g/dL — CL (ref 13.0–17.0)
MCH: 24.3 pg — ABNORMAL LOW (ref 26.0–34.0)
RBC: 2.47 MIL/uL — ABNORMAL LOW (ref 4.22–5.81)

## 2012-07-10 LAB — MRSA PCR SCREENING: MRSA by PCR: NEGATIVE

## 2012-07-10 LAB — HEMOGLOBIN AND HEMATOCRIT, BLOOD
HCT: 20.1 % — ABNORMAL LOW (ref 39.0–52.0)
Hemoglobin: 6.6 g/dL — CL (ref 13.0–17.0)

## 2012-07-10 LAB — PREPARE RBC (CROSSMATCH)

## 2012-07-10 MED ORDER — TRIAMCINOLONE ACETONIDE 0.5 % EX CREA
TOPICAL_CREAM | CUTANEOUS | Status: AC
  Filled 2012-07-10: qty 15

## 2012-07-10 MED ORDER — ALBUTEROL SULFATE (5 MG/ML) 0.5% IN NEBU
2.5000 mg | INHALATION_SOLUTION | Freq: Three times a day (TID) | RESPIRATORY_TRACT | Status: DC
  Administered 2012-07-11: 2.5 mg via RESPIRATORY_TRACT
  Filled 2012-07-10: qty 0.5

## 2012-07-10 MED ORDER — CARVEDILOL 12.5 MG PO TABS
12.5000 mg | ORAL_TABLET | Freq: Two times a day (BID) | ORAL | Status: DC
  Administered 2012-07-11: 12.5 mg via ORAL
  Filled 2012-07-10 (×2): qty 1

## 2012-07-10 MED ORDER — PANTOPRAZOLE SODIUM 40 MG PO TBEC: 40.0000 mg | DELAYED_RELEASE_TABLET | Freq: Every day | ORAL | Status: DC

## 2012-07-10 MED ORDER — PANTOPRAZOLE SODIUM 40 MG PO TBEC
40.0000 mg | DELAYED_RELEASE_TABLET | Freq: Every day | ORAL | Status: DC
Start: 2012-07-11 — End: 2012-07-10

## 2012-07-10 MED ORDER — FUROSEMIDE 10 MG/ML IJ SOLN: 20.0000 mg | Freq: Once | INTRAMUSCULAR | Status: DC

## 2012-07-10 MED ORDER — VITAMIN K1 10 MG/ML IJ SOLN
INTRAMUSCULAR | Status: AC
  Filled 2012-07-10: qty 1

## 2012-07-10 MED ORDER — PANTOPRAZOLE SODIUM 40 MG PO TBEC
40.0000 mg | DELAYED_RELEASE_TABLET | Freq: Two times a day (BID) | ORAL | Status: DC
  Administered 2012-07-10 – 2012-07-11 (×2): 40 mg via ORAL
  Filled 2012-07-10 (×2): qty 1

## 2012-07-10 MED ORDER — VITAMIN K1 10 MG/ML IJ SOLN
5.0000 mg | Freq: Once | INTRAVENOUS | Status: AC
  Administered 2012-07-10: 5 mg via INTRAVENOUS
  Filled 2012-07-10: qty 0.5

## 2012-07-10 NOTE — H&P (Addendum)
[facility] History and Physical  Jose Mcgrath ZOX:[id number] DOB: Jul 15, 1940 DOA: 07/09/2012  Referring physician: Saunders Glance PCP: Kirk Ruths, MD  Specialists: Dr. Darrick Penna, Gastroenterology, Dr. Alanda Amass, Cardiology  Chief Complaint: Blood in Stools  HPI: Jose Mcgrath is a 72 y.o. male with an extensive past medical history significant for ASCVD status post CABG in 1999, ischemic cardiomyopathy status post biventricular pacer and ICD in 2003, abdominal aortic aneurysm aortobifemoral bypass in 1999, type 2 diabetes, A. fib on chronic anticoagulation and COPD who presented via EMS to the emergency department complaining of multiple large bloody stools. The patient has been having issues with iron deficiency anemia for several months and was quite symptomatic with his anemia including generalized weakness, shortness of breath and worsening functional status at home. He was continued on his Coumadin.  Recently underwent a complete workup that has included both hematology and gastroenterology evaluations. He had an EGD 06/22/2012 which showed peptic ulcer disease and several duodenal ulcers, H. pylori positive, he was prescribed PPI and a course of antibiotics but he could not afford these and has not taken them. Following this procedure he continued to drop his hemoglobin and received transfusion as an outpatient ordered by hematology. He was then referred back to gastroenterology where he underwent both a repeat EGD and colonoscopy on 06/29/2012 during which he had numerous polyps removed and was noted to have multiple AVMs -he was instructed to resume his Coumadin on 07/07/2012 following his procedure. Today the patient developed some lower abdominal cramping with bowel movement urgency followed by multiple loose maroon color stools. At home he was very weak and dizzy, unable to walk and was instructed by Dr. Darrick Penna to go to the ED via EMS.  On arrival to the emergency department  he was mildly hypotensive and had several bloody stools His hemoglobin of 7.4, His INR was only mildly elevated at 1.5, he started coumadin 2 days ago. Admission requested for GIB.  Review of Systems: The patient complains of Generalized weakness, inability to walk, dyspnea on minimal exertion, lightheadedness and dizziness, feeling of near syncope, vision changes, mild abdominal cramping. No vomiting, nausea. He denies any fevers or chills. No chest pain or cough. He also complains of a rash on his legs and back that is intensely puritic, has very dry skin, also complains of swelling in his lower extremities with his right leg being more swollen than his left leg.   Past Medical History  Diagnosis Date  . CAD (coronary artery disease)     CABG 1999  . ICD (implantable cardiac defibrillator) in place     placed April 2007 secondary to left BBB, ischemic cariomyopathy, permanent afib   . PAD (peripheral artery disease)   . COPD (chronic obstructive pulmonary disease)   . Hyperlipidemia   . Bladder cancer     s/p resection  . Diabetes mellitus     patient says his not a diabetic   Past Surgical History  Procedure Laterality Date  . Cardiac defibrillator placement      BiV ICD April 2007 by Dr. Alanda Amass  . Abdominal aortic aneurysm repair    . Cholecystectomy    . Right inguinal hernia    . Transurethral resection of bladder tumor    . Esophagogastroduodenoscopy  06/22/2012    Procedure: ESOPHAGOGASTRODUODENOSCOPY (EGD);  Surgeon: West Bali, MD;  Location: [facility];  Service: Endoscopy;  Laterality: N/A;  11:30 AM  . Colonoscopy N/A 06/29/2012    Procedure: COLONOSCOPY;  Surgeon: West Bali, MD;  Location: [facility];  Service: Endoscopy;  Laterality: N/A;  10:30   Social History:  reports that he has been smoking Cigarettes.  He has been smoking about 1.00 pack per day. He does not have any smokeless tobacco history on file. He reports that he does not drink alcohol or use  illicit drugs. The patient lives at home with his wife, they are on on a fixed limited income, have difficulty paying for his medications.  No Known Allergies  Family History  Problem Relation Age of Onset  . Colon cancer Sister     DECEASED 12/15/12metastatic, diagnosed in her late 54s.      Prior to Admission medications   Medication Sig Start Date End Date Taking? Authorizing Provider  carvedilol (COREG) 25 MG tablet Take 25 mg by mouth 2 (two) times daily with a meal.  05/21/12  Yes Historical Provider, MD  furosemide (LASIX) 40 MG tablet Take 40 mg by mouth daily.  06/14/12  Yes Historical Provider, MD  iron polysaccharides (NU-IRON) 150 MG capsule Take 150 mg by mouth 2 (two) times daily.   Yes Historical Provider, MD  LANOXIN 0.25 MG tablet Take 0.25 mg by mouth every morning.  05/21/12  Yes Historical Provider, MD  losartan (COZAAR) 50 MG tablet Take 50 mg by mouth every morning.  06/14/12  Yes Historical Provider, MD  omeprazole (PRILOSEC) 20 MG capsule Take 1 capsule (20 mg total) by mouth 2 (two) times daily. 06/19/12  Yes Nira Retort, NP  spironolactone (ALDACTONE) 25 MG tablet Take 25 mg by mouth every morning.  05/24/12  Yes Historical Provider, MD  warfarin (COUMADIN) 10 MG tablet Take 10 mg by mouth daily.  06/14/12  Yes Historical Provider, MD   Physical Exam: Filed Vitals:   07/10/12 0100 07/10/12 0154 07/10/12 0254 07/10/12 0319  BP: 80/36  82/36 96/53  Pulse:      Temp:  98.1 F (36.7 C) 97.8 F (36.6 C) 97.9 F (36.6 C)  TempSrc:  Oral Axillary Axillary  Resp: 14 17    Height:      Weight:      SpO2:         General:  Chronically ill-appearing gentleman who is pale and weak  Eyes: No icterus, equals are equal round and reactive  ENT: Dry mucous membranes, poor dentition  Neck: Supple no adenopathy  Cardiovascular: Irregular, mild blowing systolic ejection murmur  Respiratory: Scattered rhonchi but overall good air movement and clear  Abdomen: Soft  no guarding mild lower abdominal tenderness, active bowel sounds  Skin: Dry waxy skin is pale he has an eczematous papular flat rash with multiple excoriations from scratching, and  lesions are scabbed over, flaking.  Musculoskeletal: Moves all 4 extremities, he has 2+ pitting edema in his right leg and trace to 1+ pitting edema in his left leg, he has a large surgical scar down his right leg from his aortofemoral bypass, cannot palpate pulses at the dorsalis pedis but he has good cap refill in his extremities are warm, there is no redness or erythema.  Psychiatric: Depressed flat affect  Neurologic: Nonfocal  Labs on Admission:  Basic Metabolic Panel:  Recent Labs Lab 07/09/12 1812  NA 131*  K 4.0  CL 95*  CO2 26  GLUCOSE 122*  BUN 38*  CREATININE 1.21  CALCIUM 8.8   Liver Function Tests:  Recent Labs Lab 07/09/12 1812  AST 11  ALT 5  ALKPHOS  140*  BILITOT 0.6  PROT 7.7  ALBUMIN 3.3*   No results found for this basename: LIPASE, AMYLASE,  in the last 168 hours No results found for this basename: AMMONIA,  in the last 168 hours CBC:  Recent Labs Lab 07/09/12 1812  WBC 5.6  NEUTROABS 3.5  HGB 7.4*  HCT 22.6*  MCV 71.5*  PLT 213   Cardiac Enzymes:  Recent Labs Lab 07/09/12 1812  TROPONINI <0.30    BNP (last 3 results) No results found for this basename: PROBNP,  in the last 8760 hours CBG: No results found for this basename: GLUCAP,  in the last 168 hours  Radiological Exams on Admission: Dg Abd Acute W/chest  07/09/2012  *RADIOLOGY REPORT*  Clinical Data: Weakness, fatigue, rectal bleeding  ACUTE ABDOMEN SERIES (ABDOMEN 2 VIEW & CHEST 1 VIEW)  Comparison: Prior chest x-ray 12/01/2010; CT abdomen 01/19/2010  Findings: Stable marked cardiomegaly with a left subclavian approach biventricular cardiac rhythm maintenance device.  Chronic blunting of the right cardiophrenic angle is unchanged.  Negative for edema, pleural effusion or pneumothorax.  Median  sternotomy with evidence of prior multivessel CABG.  Nonspecific bowel gas pattern.  Gas is noted within both of nondilated small bowel and colon to the level of the sigmoid colon. Surgical clips are scattered in the right upper quadrant and mid abdomen.  Extensive atherosclerotic calcification of the aorta iliac and splanchnic vessels.  No free air or significant air-fluid levels on the upright view.  IMPRESSION: 1.  No acute cardiopulmonary disease.  Stable marked cardiomegaly. 2.  Nonspecific, nonobstructed bowel gas pattern.  Extensive atherosclerotic vascular disease.   Original Report Authenticated By: Malachy Moan, M.D.     EKG: Independently reviewed. Ventricularly paced rhythm no signs of ischemia.  Assessment/Plan Active Problems:   Anemia   Chronic anticoagulation   Chronic atrial fibrillation   COPD (chronic obstructive pulmonary disease)   CAD (coronary artery disease)   CHF NYHA class III   DM2 (diabetes mellitus, type 2)   Presence of biventricular cardiac pacemaker and ICD   Transitional cell carcinoma of bladder   Abdominal aortic aneurysm   PUD (peptic ulcer disease)   AVM (arteriovenous malformation) of colon   Colon polyps   GIB (gastrointestinal bleeding)   Inadequate material resources  Mr. Peed is a 72 year old gentleman who has multiple progressive chronic diseases and significant functional status limitations from heart failure and COPD. He is on chronic anticoagulation with Coumadin atrial fibrillation but starting having shortness of breath and weakness about 2 months ago and was found to have iron deficiency anemia. A gastroenterology workup has revealed peptic ulcer disease with H. pylori ulcers as well as colonic AVMs and many benign polyps scattered throughout the colon. He is currently having what appears to be a relatively brisk lower and possibly upper GI bleed following recent polypectomy and after resuming his Coumadin 2 days ago. He is currently and  will always be a very high GI bleed risk and should have Coumadin discontinued indefinitely. Had a very long discussion with both he and his wife regarding Coumadin and the risks and benefits of discontinuing this therapy. Currently his INR is 1.3, he is hypotensive systolic pressure of less than 100, he continues to have blood he stools. His hemoglobin on arrival was 7.4. I suspect that he probably has a much lower hemoglobin given his ongoing blood loss.   Admitted to the intensive care unit  Aggressive IV fluid resuscitation for hypotension  Stat transfusion of 2  units of PRBCs, with 2 units being held ahead  After he continued with bleeding one unit of FFP was given along with a slow fusion of IV vitamin K for severe bleeding despite the fact that his INR was only mildly elevated.  GI was contacted by the ED physician and will follow patient.  IV Protonix infusion was started for acid suppression  We'll need to address his antibiotic therapy for H. pylori once he is stabilized.  He is at high risk for demand ischemia given his coronary artery disease and peripheral vascular disease will need close monitoring for complications.  His blood pressure medications were held due to hypotension.  Sliding scale insulin started for his blood sugar control  We'll need to watch his volume status with the high volume of fluid and blood products the he will likely need to receive, we'll give IV Lasix if his blood pressure can tolerate in between transfusions.  We'll likely need to discontinue Coumadin indefinitely given his AVMs and the fact that he is a high fall risk.  Hopefully we can get his coagulation system working again and he will stop bleeding on his on without any intervention and  respond to transfusions and supportive care.  Code Status: I discussed in detail his current thoughts on advance directives including a discussion on his CODE STATUS the patient wishes to be a DO NOT  RESUSCITATE however he wishes to be actively and aggressively treated for his acute blood loss and any other reversible medical conditions, he would be appropriate for temporary use of pressors to sustain his blood pressure if needed in the setting of acute blood loss anemia. He has an ICD in place.  Family Communication: I discussed his plan of care in detail with both he and his wife Disposition Plan:  Plan is for patient to discharge home after he is stabilized medically. His wife expressed to me concerns about affording his medications-they were unable to get his antibiotics for his H. pylori infection filled because they were too expensive, he often cannot afford his inhalers, and they have had very high co-pays on some of his cardiac medications. Social work and care management order placed to assist with black material resources and potentially provide community resources to help.  Time spent: 70 minute, critical care time  [facility] Pager [phone]  If 7PM-7AM, please contact night-coverage [email or web address] Password [facility] 07/10/2012, 3:34 AM

## 2012-07-10 NOTE — Plan of Care (Signed)
Problem: Phase II Progression Outcomes Goal: IV changed to normal saline lock Outcome: Progressing Pt receiving blood, but once that is complete there are no other IVF orders and pt can be NSL

## 2012-07-10 NOTE — Consult Note (Signed)
Primary Care Physician:  Kirk Ruths, MD Primary Gastroenterologist:  Dr. Darrick Penna    Date of Consultation: 07/10/12  Reason for Consultation:  Rectal bleeding, Anemia  HPI:  [name] is a 72 year old male known to our practice from a recent GI evaluation secondary to anemia and melena. I had originally seen him in the office Jun 19, 2012 secondary to anemia. His Hgb at that time was 7.9, and he had reported melena. He had noted intermittent hematochezia with straining, and a positive family history of colon cancer in his sister. He had refused at that time to be admitted to the hospital, as his course was complicated by Coumadin. He received 1 unit of blood the next day in preparation for an outpatient procedure. He underwent an upper endoscopy by Dr. Darrick Penna on 1/31 noting PUD. As Coumadin was on hold, a colonoscopy was then performed several days later, noting multiple polyps (41). Also noted colonic AVMs.   He presented to the ED yesterday with multiple episodes of rectal bleeding. Hgb noted to be 7.4. His Coumadin was restarted feb 15th. Denies abdominal pain. Feels better since admission, and he has received 3 units PRBCs. HE DID NOT pick up the treatment for H.pylori, as he states he had no funds. He has had 4 bloody stools overnight, none since 5am.   Past Medical History  Diagnosis Date  . CAD (coronary artery disease)     CABG 1999  . ICD (implantable cardiac defibrillator) in place     placed April 2007 secondary to left BBB, ischemic cariomyopathy, permanent afib   . PAD (peripheral artery disease)   . COPD (chronic obstructive pulmonary disease)   . Hyperlipidemia   . Bladder cancer     s/p resection  . Diabetes mellitus     patient says his not a diabetic    Past Surgical History  Procedure Laterality Date  . Cardiac defibrillator placement      BiV ICD April 2007 by Dr. Alanda Amass  . Abdominal aortic aneurysm repair    . Cholecystectomy    . Right inguinal hernia     . Transurethral resection of bladder tumor    . Esophagogastroduodenoscopy  06/22/2012     Dr. Darrick Penna: PUD, +H.pylori  . Colonoscopy N/A 06/29/2012    Dr. Darrick Penna: multiple polyps (41)tubular adenomas, colonic AVMs    Prior to Admission medications   Medication Sig Start Date End Date Taking? Authorizing Provider  carvedilol (COREG) 25 MG tablet Take 25 mg by mouth 2 (two) times daily with a meal.  05/21/12  Yes Historical Provider, MD  furosemide (LASIX) 40 MG tablet Take 40 mg by mouth daily.  06/14/12  Yes Historical Provider, MD  iron polysaccharides (NU-IRON) 150 MG capsule Take 150 mg by mouth 2 (two) times daily.   Yes Historical Provider, MD  LANOXIN 0.25 MG tablet Take 0.25 mg by mouth every morning.  05/21/12  Yes Historical Provider, MD  losartan (COZAAR) 50 MG tablet Take 50 mg by mouth every morning.  06/14/12  Yes Historical Provider, MD  omeprazole (PRILOSEC) 20 MG capsule Take 1 capsule (20 mg total) by mouth 2 (two) times daily. 06/19/12  Yes Nira Retort, NP  spironolactone (ALDACTONE) 25 MG tablet Take 25 mg by mouth every morning.  05/24/12  Yes Historical Provider, MD  warfarin (COUMADIN) 10 MG tablet Take 10 mg by mouth daily.  06/14/12  Yes Historical Provider, MD    Current Facility-Administered Medications  Medication Dose Route Frequency Provider Last Rate  Last Dose  . acetaminophen (TYLENOL) tablet 650 mg  650 mg Oral Q6H PRN Edsel Petrin, DO       Or  . acetaminophen (TYLENOL) suppository 650 mg  650 mg Rectal Q6H PRN Edsel Petrin, DO      . albuterol (PROVENTIL) (5 MG/ML) 0.5% nebulizer solution 2.5 mg  2.5 mg Nebulization Q6H Edsel Petrin, DO   2.5 mg at 07/10/12 0705  . albuterol (PROVENTIL) (5 MG/ML) 0.5% nebulizer solution 2.5 mg  2.5 mg Nebulization Q2H PRN Edsel Petrin, DO      . camphor-menthol [facility]) lotion 1 application  1 application Topical Q6H PRN Edsel Petrin, DO      . carvedilol (COREG) tablet 12.5 mg  12.5 mg Oral BID  WC Nimish Normajean Glasgow, MD      . digoxin (LANOXIN) tablet 0.25 mg  0.25 mg Oral q morning - 10a Edsel Petrin, DO   0.25 mg at 07/10/12 1034  . furosemide (LASIX) injection 20 mg  20 mg Intravenous Once Edsel Petrin, DO      . furosemide (LASIX) injection 20 mg  20 mg Intravenous Once Edsel Petrin, DO      . morphine 2 MG/ML injection 1 mg  1 mg Intravenous Q2H PRN Edsel Petrin, DO      . ondansetron [facility]) tablet 4 mg  4 mg Oral Q6H PRN Edsel Petrin, DO       Or  . ondansetron [facility]) injection 4 mg  4 mg Intravenous Q6H PRN Edsel Petrin, DO      . [START ON 07/11/2012] pantoprazole (PROTONIX) EC tablet 40 mg  40 mg Oral QAC breakfast [place], MD      . sodium chloride 0.9 % injection 3 mL  3 mL Intravenous Q12H Edsel Petrin, DO   3 mL at 07/10/12 1034  . triamcinolone cream (KENALOG) 0.5 %   Topical BID Edsel Petrin, DO        Allergies as of 07/09/2012  . (No Known Allergies)    Family History  Problem Relation Age of Onset  . Colon cancer Sister     DECEASED December 29, 2012metastatic, diagnosed in her late 2s.     History   Social History  . Marital Status: Married    Spouse Name: N/A    Number of Children: N/A  . Years of Education: N/A   Occupational History  . Not on file.   Social History Main Topics  . Smoking status: Current Every Day Smoker -- 1.00 packs/day    Types: Cigarettes  . Smokeless tobacco: Not on file  . Alcohol Use: No     Comment: remote hx of ETOH abuse in his 20s-30s  . Drug Use: No  . Sexually Active: Not on file   Other Topics Concern  . Not on file   Social History Narrative  . No narrative on file    Review of Systems: Gen: +weakness CV: Denies chest pain, heart palpitations, syncope, edema  Resp: Denies shortness of breath with rest, cough, wheezing GI: SEE HPI GU : Denies urinary burning, urinary frequency, urinary incontinence.  MS: complains of leg pain Derm: Denies  rash, itching, dry skin Psych: Denies depression, anxiety,confusion, or memory loss Heme: SEE HPI  Physical Exam: Vital signs in last 24 hours: Temp:  [97.2 F (36.2 C)-98.9 F (37.2 C)] 97.7 F (36.5 C) (02/18 1030) Pulse Rate:  [43-80] 71 (02/18 1034) Resp:  [  6-24] 16 (02/18 1030) BP: (80-144)/(30-67) 111/57 mmHg (02/18 1030) SpO2:  [89 %-100 %] 99 % (02/18 1030) Weight:  [179 lb 14.3 oz (81.6 kg)-190 lb (86.183 kg)] 184 lb 11.9 oz (83.8 kg) (02/18 0515) Last BM Date: 07/10/12 General:   Alert,  Appears chronically ill  Head:  Normocephalic and atraumatic. Eyes:  Sclera clear, no icterus.   Conjunctiva pink. Ears:  Normal auditory acuity. Nose:  No deformity, discharge,  or lesions. Mouth:  Poor dentition Neck:  Supple; no masses or thyromegaly. Lungs:  Clear throughout to auscultation.   No wheezes, crackles, or rhonchi. No acute distress. Heart:  S1 S2 present Abdomen:  Soft, nontender and nondistended. No masses, hepatosplenomegaly or hernias noted. Normal bowel sounds, without guarding, and without rebound.   Rectal:  Deferred  Msk:  Symmetrical without gross deformities. Normal posture. Pulses:  Normal pulses noted. Extremities:  Without clubbing or edema. Neurologic:  Alert and  oriented x4;  grossly normal neurologically. Skin:  Intact without significant lesions or rashes. Cervical Nodes:  No significant cervical adenopathy. Psych:  Alert and cooperative. Normal mood and affect.  Intake/Output from previous day: 02/17 0701 - 02/18 0700 In: 50 [IV Piggyback:50] Out: 150 [Urine:150] Intake/Output this shift: Total I/O In: 135.5 [I.V.:3; Blood:132.5] Out: 200 [Urine:200]  Lab Results:  Recent Labs  07/09/12 1812 07/10/12 0350  WBC 5.6 5.8  HGB 7.4* 6.0*  HCT 22.6* 17.9*  PLT 213 162   BMET  Recent Labs  07/09/12 1812  NA 131*  K 4.0  CL 95*  CO2 26  GLUCOSE 122*  BUN 38*  CREATININE 1.21  CALCIUM 8.8   LFT  Recent Labs  07/09/12 1812   PROT 7.7  ALBUMIN 3.3*  AST 11  ALT 5  ALKPHOS 140*  BILITOT 0.6   PT/INR  Recent Labs  07/09/12 1812  LABPROT 16.5*  INR 1.37    Studies/Results: Dg Abd Acute W/chest  07/09/2012  *RADIOLOGY REPORT*  Clinical Data: Weakness, fatigue, rectal bleeding  ACUTE ABDOMEN SERIES (ABDOMEN 2 VIEW & CHEST 1 VIEW)  Comparison: Prior chest x-ray 12/01/2010; CT abdomen 01/19/2010  Findings: Stable marked cardiomegaly with a left subclavian approach biventricular cardiac rhythm maintenance device.  Chronic blunting of the right cardiophrenic angle is unchanged.  Negative for edema, pleural effusion or pneumothorax.  Median sternotomy with evidence of prior multivessel CABG.  Nonspecific bowel gas pattern.  Gas is noted within both of nondilated small bowel and colon to the level of the sigmoid colon. Surgical clips are scattered in the right upper quadrant and mid abdomen.  Extensive atherosclerotic calcification of the aorta iliac and splanchnic vessels.  No free air or significant air-fluid levels on the upright view.  IMPRESSION: 1.  No acute cardiopulmonary disease.  Stable marked cardiomegaly. 2.  Nonspecific, nonobstructed bowel gas pattern.  Extensive atherosclerotic vascular disease.   Original Report Authenticated By: Malachy Moan, M.D.     Impression: 72 year old male with rectal bleeding, significant anemia requiring transfusion, s/p colonoscopy 2/7 noting multiple polyps, colonic AVMs. Also notable is recent diagnosis of PUD, +H.pylori, no treatment commenced due to lack of funds. He has multiple comorbidities, and he resumed Coumadin on 2/15. At this point, he is not willing to proceed with re-evaluation of his colon +/- upper GI tract. He states he would rather pursue supportive care. Coumadin has been held. With the presence of Coumadin, bleeding could be originating from colonic AVMs, possible post-polypectomy bleed, known peptic ulcer, or even small bowel involvement. As he  desires  supportive care, we will continue to monitor closely and hold off on procedures currently. He has received a total of 3 units this admission, with the 4th unit infusing at time of consultation.   Plan: Aggressive supportive measures Continue ICU support Coumadin on hold indefinitely Change Protonix to twice daily by mouth Needs treatment for H.pylori once stabilized Repeat EGD in 3 months Surveillance colonoscopy in 1-3 years for now; pt desires to hold off on invasive procedure currently. If continued bleeding despite absence of Coumadin, will need to discuss further with patient.    LOS: 1 day   Gerrit Halls  07/10/2012, 10:40 AM  Patient seen and examined this morning. Bleeding seems to have tapered off. We'll allow clear liquids. We'll go ahead and initiate H. pylori treatment while he is here and complete therapy prior to resuming anticoagulation therapy-this will minimize drug (antibiotic) interactions with Coumadin.

## 2012-07-10 NOTE — Telephone Encounter (Addendum)
SPOKE WITH DR. Alanda Amass. PLAN TO RESTART COUMADIN FEB 24.  CALLED WIFE TO DISCUSS RESULTS. NO ANSWER. NO VM.

## 2012-07-10 NOTE — Progress Notes (Signed)
UR Chart Review Completed  

## 2012-07-10 NOTE — Progress Notes (Signed)
CRITICAL VALUE ALERT  Critical value received:  Hgb 6.0  Date of notification:  2/18   Time of notification:  0420  Critical value read back: yes  Nurse who received alert:  Floreen Comber, RN  MD notified (1st page):  Phillips Odor  Time of first page:  0425  MD notified (2nd page):  Time of second page:  Responding MD:  Phillips Odor  Time MD responded:  [phone]

## 2012-07-10 NOTE — Plan of Care (Addendum)
DISCUSSED WITH PT. SHOULD HAVE TCS IN ONE YEAR. ALL FIRST DEGREE RELATIIVES NEEDS A TCS at age 72 and every 5 years. Next TCS in 1 year. OPV IN 1 YEARS TO DISCUSS BENEFITS V. RISKS. HAS ONE DAUGHTER AND HIS SIBLINGS ARE DECEASED.  No bleeding today. Would like his diet advanced and would like to go home tomorrow.

## 2012-07-10 NOTE — Clinical Social Work Note (Signed)
CSW received referral for assistance with medication/copays. Referral forwarded to CM. CSW signing off but can be reconsulted if needed.   Derenda Fennel, [place] [phone]

## 2012-07-10 NOTE — Care Management Note (Signed)
    Page 1 of 1   07/11/2012     1:41:32 PM   CARE MANAGEMENT NOTE 07/11/2012  Patient:  ELIGE, SHOUSE   Account Number:  [id number]  Date Initiated:  07/10/2012  Documentation initiated by:  Rosemary Holms  Subjective/Objective Assessment:   Pt admitted from home with a GI bleed. Receiving transfusions. CM spoke to pt regarding his inabliity to afford medications. Stated he buys his meds but could not afford one of them. He stated the one he could not afford was the AB for his     Action/Plan:   stomach. CM asked what it cost and he said he did not know but "if it costs $2, he could not afford it" "when the money is gone it is gone." CM left message for wife to call and discuss.   Anticipated DC Date:     Anticipated DC Plan:  HOME/SELF CARE         Choice offered to / List presented to:             Status of service:  Completed, signed off Medicare Important Message given?   (If response is "NO", the following Medicare IM given date fields will be blank) Date Medicare IM given:   Date Additional Medicare IM given:    Discharge Disposition:  HOME/SELF CARE  Per UR Regulation:    If discussed at Long Length of Stay Meetings, dates discussed:    Comments:  07/11/12 Rosemary Holms RN BSN CM Pt DC'd. Wife never returned CM's call and no request made for medication assistance.  07/10/12 Almin Livingstone Robsson RN BSN CM

## 2012-07-10 NOTE — Progress Notes (Signed)
Subjective: This man has presented with a significant GI bleed, probably lower. So far he has had 3 units of blood and one is still pending. His hemoglobin is only 6 despite these blood transfusions. Fortunately, he has remained relatively hemodynamically stable.           Physical Exam: Blood pressure 100/39, pulse 72, temperature 98.9 F (37.2 C), temperature source Oral, resp. rate 16, height 6\' 2"  (1.88 m), weight 83.8 kg (184 lb 11.9 oz), SpO2 97.00%. He is alert. Abdomen is soft and nontender. He remains in atrial fibrillation. Lung fields are clear.   Investigations:  Recent Results (from the past 240 hour(s))  MRSA PCR SCREENING     Status: None   Collection Time    07/09/12  9:39 PM      Result Value Range Status   MRSA by PCR NEGATIVE  NEGATIVE Final   Comment:            The GeneXpert MRSA Assay (FDA     approved for NASAL specimens     only), is one component of a     comprehensive MRSA colonization     surveillance program. It is not     intended to diagnose MRSA     infection nor to guide or     monitor treatment for     MRSA infections.     Basic Metabolic Panel:  Recent Labs  16/10/96 1812  NA 131*  K 4.0  CL 95*  CO2 26  GLUCOSE 122*  BUN 38*  CREATININE 1.21  CALCIUM 8.8   Liver Function Tests:  Recent Labs  07/09/12 1812  AST 11  ALT 5  ALKPHOS 140*  BILITOT 0.6  PROT 7.7  ALBUMIN 3.3*     CBC:  Recent Labs  07/09/12 1812 07/10/12 0350  WBC 5.6 5.8  NEUTROABS 3.5  --   HGB 7.4* 6.0*  HCT 22.6* 17.9*  MCV 71.5* 72.5*  PLT 213 162    Dg Abd Acute W/chest  07/09/2012  *RADIOLOGY REPORT*  Clinical Data: Weakness, fatigue, rectal bleeding  ACUTE ABDOMEN SERIES (ABDOMEN 2 VIEW & CHEST 1 VIEW)  Comparison: Prior chest x-ray 12/01/2010; CT abdomen 01/19/2010  Findings: Stable marked cardiomegaly with a left subclavian approach biventricular cardiac rhythm maintenance device.  Chronic blunting of the right  cardiophrenic angle is unchanged.  Negative for edema, pleural effusion or pneumothorax.  Median sternotomy with evidence of prior multivessel CABG.  Nonspecific bowel gas pattern.  Gas is noted within both of nondilated small bowel and colon to the level of the sigmoid colon. Surgical clips are scattered in the right upper quadrant and mid abdomen.  Extensive atherosclerotic calcification of the aorta iliac and splanchnic vessels.  No free air or significant air-fluid levels on the upright view.  IMPRESSION: 1.  No acute cardiopulmonary disease.  Stable marked cardiomegaly. 2.  Nonspecific, nonobstructed bowel gas pattern.  Extensive atherosclerotic vascular disease.   Original Report Authenticated By: Malachy Moan, M.D.       Medications: I have reviewed the patient's current medications.  Impression: 1. Significant GI bleed, likely lower, requiring 3 units of blood transfusion so far, hemoglobin only 6. Previous history of peptic ulcer disease and AVMs. 2. Chronic atrial fibrillation on chronic anticoagulation. 3. COPD, stable. 4. Coronary artery disease, stable. 5. Type 2 diabetes mellitus. 6. Status post biventricular pacemaker and ICD.     Plan: 1. Continue to transfuse blood as required. 2. Gastroenterology, Dr. Darrick Penna  will see patient, will likely need endoscopic procedures today.     LOS: 1 day   Wilson Singer Pager [phone]  07/10/2012, 7:36 AM

## 2012-07-11 ENCOUNTER — Telehealth: Payer: Self-pay | Admitting: Urgent Care

## 2012-07-11 ENCOUNTER — Other Ambulatory Visit: Payer: Self-pay

## 2012-07-11 DIAGNOSIS — K922 Gastrointestinal hemorrhage, unspecified: Secondary | ICD-10-CM

## 2012-07-11 DIAGNOSIS — Q2733 Arteriovenous malformation of digestive system vessel: Secondary | ICD-10-CM

## 2012-07-11 DIAGNOSIS — K279 Peptic ulcer, site unspecified, unspecified as acute or chronic, without hemorrhage or perforation: Secondary | ICD-10-CM

## 2012-07-11 DIAGNOSIS — D649 Anemia, unspecified: Secondary | ICD-10-CM

## 2012-07-11 DIAGNOSIS — Z79899 Other long term (current) drug therapy: Secondary | ICD-10-CM

## 2012-07-11 DIAGNOSIS — Z7901 Long term (current) use of anticoagulants: Secondary | ICD-10-CM

## 2012-07-11 DIAGNOSIS — K5520 Angiodysplasia of colon without hemorrhage: Secondary | ICD-10-CM

## 2012-07-11 DIAGNOSIS — A048 Other specified bacterial intestinal infections: Secondary | ICD-10-CM

## 2012-07-11 LAB — COMPREHENSIVE METABOLIC PANEL
AST: 11 U/L (ref 0–37)
Albumin: 2.8 g/dL — ABNORMAL LOW (ref 3.5–5.2)
Alkaline Phosphatase: 108 U/L (ref 39–117)
Chloride: 103 mEq/L (ref 96–112)
Potassium: 3.9 mEq/L (ref 3.5–5.1)
Sodium: 135 mEq/L (ref 135–145)
Total Bilirubin: 1.3 mg/dL — ABNORMAL HIGH (ref 0.3–1.2)

## 2012-07-11 LAB — PREPARE FRESH FROZEN PLASMA

## 2012-07-11 LAB — CBC
Platelets: 139 10*3/uL — ABNORMAL LOW (ref 150–400)
RDW: 18.7 % — ABNORMAL HIGH (ref 11.5–15.5)
WBC: 5.7 10*3/uL (ref 4.0–10.5)

## 2012-07-11 MED ORDER — CAMPHOR-MENTHOL 0.5-0.5 % EX LOTN: 1.0000 "application " | TOPICAL_LOTION | Freq: Four times a day (QID) | CUTANEOUS | Status: DC | PRN

## 2012-07-11 MED ORDER — TRIAMCINOLONE ACETONIDE 0.5 % EX CREA: TOPICAL_CREAM | Freq: Two times a day (BID) | CUTANEOUS | Status: DC

## 2012-07-11 NOTE — Telephone Encounter (Signed)
T/C from [place], pharmacist at [facility]. She said when she keys in Biaxin, it is coming up with interaction with coumadin. ( Please advise or she doesn't mind checking with pt's cardiologist).

## 2012-07-11 NOTE — Progress Notes (Signed)
Subjective: Pt denies any bleeding.  Denies N/V or abdominal pain.  "I am ready to go home"  Objective: Vital signs in last 24 hours: Temp:  [97.5 F (36.4 C)-99.4 F (37.4 C)] 97.8 F (36.6 C) (02/19 0800) Pulse Rate:  [66-77] 73 (02/19 0600) Resp:  [5-24] 21 (02/19 0600) BP: (90-134)/(33-82) 120/49 mmHg (02/19 0600) SpO2:  [92 %-100 %] 96 % (02/19 0729) Weight:  [190 lb 0.6 oz (86.2 kg)] 190 lb 0.6 oz (86.2 kg) (02/19 0500) Last BM Date: 07/10/12 No LMP for male patient. Body mass index is 24.39 kg/(m^2). General:   Alert, pleasant and cooperative in NAD Eyes:  Sclera clear, no icterus.   Conjunctiva pink. Mouth: oropharynx pink & moist. Heart:  Regular rate and rhythm Abdomen:   Normal bowel sounds.  Soft, nontender and nondistended. No guarding or rebound tenderness.   Msk:  Symmetrical without gross deformities. Extremities:  Without edema. Neurologic:  Alert and  oriented x4;  grossly normal neurologically. Skin:  Intact without significant lesions or rashes. Psych:  Alert and cooperative. Normal mood and affect.  Intake/Output from previous day: 02/18 0701 - 02/19 0700 In: 2764 [P.O.:444; I.V.:806; Blood:1514] Out: 1650 [Urine:1650]  Lab Results:  Recent Labs  07/09/12 1812 07/10/12 0350 07/10/12 1239 07/11/12 0501  WBC 5.6 5.8  --  5.7  HGB 7.4* 6.0* 6.6* 8.9*  HCT 22.6* 17.9* 20.1* 26.3*  PLT 213 162  --  139*   BMET  Recent Labs  07/09/12 1812 07/11/12 0501  NA 131* 135  K 4.0 3.9  CL 95* 103  CO2 26 25  GLUCOSE 122* 95  BUN 38* 24*  CREATININE 1.21 1.07  CALCIUM 8.8 8.4   LFT  Recent Labs  07/09/12 1812 07/11/12 0501  PROT 7.7 6.1  ALBUMIN 3.3* 2.8*  AST 11 11  ALT 5 <5  ALKPHOS 140* 108  BILITOT 0.6 1.3*   PT/INR  Recent Labs  07/09/12 1812 07/11/12 0501  LABPROT 16.5* 17.0*  INR 1.37 1.42   Studies/Results: Dg Abd Acute W/chest  07/09/2012  *RADIOLOGY REPORT*  Clinical Data: Weakness, fatigue, rectal bleeding  ACUTE  ABDOMEN SERIES (ABDOMEN 2 VIEW & CHEST 1 VIEW)  Comparison: Prior chest x-ray 12/01/2010; CT abdomen 01/19/2010  Findings: Stable marked cardiomegaly with a left subclavian approach biventricular cardiac rhythm maintenance device.  Chronic blunting of the right cardiophrenic angle is unchanged.  Negative for edema, pleural effusion or pneumothorax.  Median sternotomy with evidence of prior multivessel CABG.  Nonspecific bowel gas pattern.  Gas is noted within both of nondilated small bowel and colon to the level of the sigmoid colon. Surgical clips are scattered in the right upper quadrant and mid abdomen.  Extensive atherosclerotic calcification of the aorta iliac and splanchnic vessels.  No free air or significant air-fluid levels on the upright view.  IMPRESSION: 1.  No acute cardiopulmonary disease.  Stable marked cardiomegaly. 2.  Nonspecific, nonobstructed bowel gas pattern.  Extensive atherosclerotic vascular disease.   Original Report Authenticated By: Malachy Moan, M.D.     Assessment: 1. GI Bleed s/p multiple colon polypectomies, H pylori PUD & colonic AVMS with recent restart of coumadin:  No further bleeding, Coumadin on hold indefinitely per cardiology 2. H pylori:  Will need treatment outpatient.   3. Anemia: Stable s/p 9 units PRBCs total  Plan: 1. Pt advised to pick up & start H pylori treatment as soon as possible.  Pt advised of possible interaction with coumadin.  Will need Digoxin level check  3 days after initiation. 2. Further instructions for coumadin per cardiology.   3. OV to set up EGD in 3 months 4. Call sooner if any problems 5. Colonoscopy in 1 year 6. Continue BID PPI  LOS: 2 days   Lorenza Burton  07/11/2012, 8:48 AM

## 2012-07-11 NOTE — Discharge Summary (Addendum)
Physician Discharge Summary  Jose Mcgrath:[id number] DOB: 1940/12/05 DOA: 07/09/2012  PCP: Kirk Ruths, MD  Admit date: 07/09/2012 Discharge date: 07/11/2012  Time spent: Greater than 30 minutes  Recommendations for Outpatient Follow-up:  1. Followup with primary care physician in one week or so. 2. Followup with gastroenterology in a couple weeks.  3. Followup with cardiology to discuss the need for anticoagulation within a week.  Discharge Diagnoses:  1. Significant lower GI bleed with acute blood loss anemia requiring 9 units of blood. Patient did not want further investigations. Patient has a history of peptic ulcer disease and AVMs. 2. Chronic atrial fibrillation on chronic anticoagulation. Warfarin discontinued and I have recommended that he never restart this again. Consider use of aspirin rather than warfarin in this setting. 3. COPD, stable. 4. Coronary artery disease, stable. 5. Type 2 diabetes mellitus. 6. Status post biventricular pacemaker and ICD in situ.   Discharge Condition: Stable.  Diet recommendation: Regular.  Filed Weights   07/09/12 2115 07/10/12 0515 07/11/12 0500  Weight: 179 lb 14.3 oz (81.6 kg) 184 lb 11.9 oz (83.8 kg) 190 lb 0.6 oz (86.2 kg)    History of present illness:  This pleasant 72 year old man was admitted with symptoms of rectal bleeding. Please see initial history as outlined below: HPI: Jose Mcgrath is a 72 y.o. male with an extensive past medical history significant for ASCVD status post CABG in 1999, ischemic cardiomyopathy status post biventricular pacer and ICD in 2003, abdominal aortic aneurysm aortobifemoral bypass in 1999, type 2 diabetes, A. fib on chronic anticoagulation and COPD who presented via EMS to the emergency department complaining of multiple large bloody stools. The patient has been having issues with iron deficiency anemia for several months and was quite symptomatic with his anemia including generalized  weakness, shortness of breath and worsening functional status at home. He was continued on his Coumadin. Recently underwent a complete workup that has included both hematology and gastroenterology evaluations. He had an EGD 06/22/2012 which showed peptic ulcer disease and several duodenal ulcers, H. pylori positive, he was prescribed PPI and a course of antibiotics but he could not afford these and has not taken them. Following this procedure he continued to drop his hemoglobin and received transfusion as an outpatient ordered by hematology. He was then referred back to gastroenterology where he underwent both a repeat EGD and colonoscopy on 06/29/2012 during which he had numerous polyps removed and was noted to have multiple AVMs -he was instructed to resume his Coumadin on 07/07/2012 following his procedure. Today the patient developed some lower abdominal cramping with bowel movement urgency followed by multiple loose maroon color stools. At home he was very weak and dizzy, unable to walk and was instructed by Dr. Darrick Penna to go to the ED via EMS.  On arrival to the emergency department he was mildly hypotensive and had several bloody stools His hemoglobin of 7.4, His INR was only mildly elevated at 1.5, he started coumadin 2 days ago. Admission requested for GIB.  Hospital Course:  Patient was admitted and given blood transfusion. He initially was hemodynamically unstable but regained stability soon. He was seen by gastroenterology and he made it clear that he did not wish any further investigations such as colonoscopy or EGD. He recently had EGD at the end of January 2014 which showed peptic ulcer disease and several duodenal ulcers, H. pylori positive and he was prescribed PPI the course of antibiotics. Unfortunately he could not afford the antibiotics. He  also had colonoscopy in February, just over a week ago and was found to have multiple AVMs. Fortunately the bleeding has virtually stopped now, after he  has had 9 units of blood, his hemoglobin is still somewhat low at 8.9. The patient is insisting on going home and he promises to come back to the emergency room immediately if he has further rectal bleeding.  Procedures:  None.   Consultations:  Gastroenterology, Dr. Kendell Bane.  Discharge Exam: Filed Vitals:   07/11/12 0400 07/11/12 0500 07/11/12 0600 07/11/12 0729  BP: 134/52 111/44 120/49   Pulse: 76 70 73   Temp:      TempSrc:      Resp: 24 13 21    Height:      Weight:  190 lb 0.6 oz (86.2 kg)    SpO2: 95% 94% 95% 96%    General: He looks systemically well. He is no longer pale. Cardiovascular: Heart sounds are present and appear to be in atrial fibrillation, rate controlled. Respiratory: Lung fields are clear. He is alert and orientated.  Discharge Instructions  Discharge Orders   Future Orders Complete By Expires     Diet - low sodium heart healthy  As directed     Increase activity slowly  As directed         Medication List    STOP taking these medications       warfarin 10 MG tablet  Commonly known as:  COUMADIN      TAKE these medications       camphor-menthol lotion  Commonly known as:  SARNA  Apply 1 application topically every 6 (six) hours as needed for itching.     carvedilol 25 MG tablet  Commonly known as:  COREG  Take 25 mg by mouth 2 (two) times daily with a meal.     furosemide 40 MG tablet  Commonly known as:  LASIX  Take 40 mg by mouth daily.     LANOXIN 0.25 MG tablet  Generic drug:  digoxin  Take 0.25 mg by mouth every morning.     losartan 50 MG tablet  Commonly known as:  COZAAR  Take 50 mg by mouth every morning.     NU-IRON 150 MG capsule  Generic drug:  iron polysaccharides  Take 150 mg by mouth 2 (two) times daily.     omeprazole 20 MG capsule  Commonly known as:  PRILOSEC  Take 1 capsule (20 mg total) by mouth 2 (two) times daily.     spironolactone 25 MG tablet  Commonly known as:  ALDACTONE  Take 25 mg by mouth  every morning.     triamcinolone cream 0.5 %  Commonly known as:  KENALOG  Apply topically 2 (two) times daily.          The results of significant diagnostics from this hospitalization (including imaging, microbiology, ancillary and laboratory) are listed below for reference.    Significant Diagnostic Studies: Dg Abd Acute W/chest  07/09/2012  *RADIOLOGY REPORT*  Clinical Data: Weakness, fatigue, rectal bleeding  ACUTE ABDOMEN SERIES (ABDOMEN 2 VIEW & CHEST 1 VIEW)  Comparison: Prior chest x-ray 12/01/2010; CT abdomen 01/19/2010  Findings: Stable marked cardiomegaly with a left subclavian approach biventricular cardiac rhythm maintenance device.  Chronic blunting of the right cardiophrenic angle is unchanged.  Negative for edema, pleural effusion or pneumothorax.  Median sternotomy with evidence of prior multivessel CABG.  Nonspecific bowel gas pattern.  Gas is noted within both of nondilated small bowel and colon  to the level of the sigmoid colon. Surgical clips are scattered in the right upper quadrant and mid abdomen.  Extensive atherosclerotic calcification of the aorta iliac and splanchnic vessels.  No free air or significant air-fluid levels on the upright view.  IMPRESSION: 1.  No acute cardiopulmonary disease.  Stable marked cardiomegaly. 2.  Nonspecific, nonobstructed bowel gas pattern.  Extensive atherosclerotic vascular disease.   Original Report Authenticated By: Malachy Moan, M.D.     Microbiology: Recent Results (from the past 240 hour(s))  MRSA PCR SCREENING     Status: None   Collection Time    07/09/12  9:39 PM      Result Value Range Status   MRSA by PCR NEGATIVE  NEGATIVE Final   Comment:            The GeneXpert MRSA Assay (FDA     approved for NASAL specimens     only), is one component of a     comprehensive MRSA colonization     surveillance program. It is not     intended to diagnose MRSA     infection nor to guide or     monitor treatment for     MRSA  infections.     Labs: Basic Metabolic Panel:  Recent Labs Lab 07/09/12 1812 07/11/12 0501  NA 131* 135  K 4.0 3.9  CL 95* 103  CO2 26 25  GLUCOSE 122* 95  BUN 38* 24*  CREATININE 1.21 1.07  CALCIUM 8.8 8.4   Liver Function Tests:  Recent Labs Lab 07/09/12 1812 07/11/12 0501  AST 11 11  ALT 5 <5  ALKPHOS 140* 108  BILITOT 0.6 1.3*  PROT 7.7 6.1  ALBUMIN 3.3* 2.8*     CBC:  Recent Labs Lab 07/09/12 1812 07/10/12 0350 07/10/12 1239 07/11/12 0501  WBC 5.6 5.8  --  5.7  NEUTROABS 3.5  --   --   --   HGB 7.4* 6.0* 6.6* 8.9*  HCT 22.6* 17.9* 20.1* 26.3*  MCV 71.5* 72.5*  --  77.4*  PLT 213 162  --  139*   Cardiac Enzymes:  Recent Labs Lab 07/09/12 1812  TROPONINI <0.30        Signed:  Wilson Singer  [facility] 07/11/2012, 7:51 AM

## 2012-07-11 NOTE — Telephone Encounter (Signed)
Pt being D/C'd home today will need 10 week FU to set up EGD Also needs Digoxin level 3 days after start of h pylori I will put in order Thanks

## 2012-07-11 NOTE — Telephone Encounter (Signed)
Called and informed Tammy at [facility].

## 2012-07-11 NOTE — Telephone Encounter (Signed)
Called and pt's wife is aware of the medications at the pharmacy, take the omeprazole bid, and do the digoxin level 3 days after beginning the antibiotics.

## 2012-07-11 NOTE — Telephone Encounter (Signed)
Pt is not currently on coumadin due to GI bleed Please let pharmacist know Thanks

## 2012-07-11 NOTE — Progress Notes (Signed)
Pt to be discharged per MD order. All discharge instructions and medications gone over with pt and pts spouse using teach back method. Stressed importance of keeping follow up appointments and getting H. Pylori medication that the GI MD called into [facility] pharmacy. Pt given card by GI MD and told to call the office if he can not afford his medications. Pt discharged via wheelchair with personal belongings.

## 2012-07-11 NOTE — Telephone Encounter (Signed)
I called pharmacy. H. Pylori meds had not been sent in. Gave the order for the Amoxicin and the Biaxin to Tammy, pharmacist at [facility]. Pt is already on omeprazole bid. I called pt, LMOM for a return call.  Lab order has been faxed to [facility] .

## 2012-07-12 LAB — TYPE AND SCREEN
Unit division: 0
Unit division: 0
Unit division: 0
Unit division: 0
Unit division: 0
Unit division: 0
Unit division: 0

## 2012-07-12 NOTE — Telephone Encounter (Signed)
REVIEWED. AGREE. 

## 2012-07-12 NOTE — Telephone Encounter (Signed)
Patient is scheduled with Dr. Alanda Amass on Tues March 24th and Patient is aware

## 2012-07-12 NOTE — Telephone Encounter (Signed)
PT NEEDS OPV WITH DR. Alanda Amass IN 2-3 WEEKS TO DISCUSS BENEFITS V. RISKS OF RESTARTING COUMADIN.

## 2012-07-16 ENCOUNTER — Telehealth: Payer: Self-pay | Admitting: Urgent Care

## 2012-07-16 NOTE — Telephone Encounter (Signed)
LMOM to get labs done if not already and to call and let me know that he got the message.

## 2012-07-16 NOTE — Telephone Encounter (Signed)
Please remind pt to have Digoxin level drawn today if not already done. Thanks

## 2012-07-17 NOTE — Telephone Encounter (Signed)
Called. Many rings and then a message that only said to enter your access code, Could not leave a message.

## 2012-07-17 NOTE — Telephone Encounter (Signed)
Reminder in epic to follow up

## 2012-07-18 ENCOUNTER — Other Ambulatory Visit: Payer: Self-pay

## 2012-07-18 DIAGNOSIS — Z79899 Other long term (current) drug therapy: Secondary | ICD-10-CM

## 2012-07-18 LAB — DIGOXIN LEVEL: Digoxin Level: 1.6 ng/mL (ref 0.8–2.0)

## 2012-07-18 NOTE — Progress Notes (Signed)
Quick Note:  Called and informed pt. Lab order being faxed to [facility]. ______

## 2012-07-18 NOTE — Telephone Encounter (Signed)
Pt had it done on 07/17/2012. See separate note.

## 2012-07-18 NOTE — Progress Notes (Signed)
Quick Note:  Please let patient know digoxin level is normal. Complete antibiotics. Recheck in one week. Please put in order. Thanks ZO:[id number] M, MD  ______

## 2012-07-25 ENCOUNTER — Telehealth: Payer: Self-pay | Admitting: Urgent Care

## 2012-07-25 NOTE — Telephone Encounter (Signed)
Doris, Dig level was to be checked again this week. Please remind pt. Thanks

## 2012-07-26 LAB — DIGOXIN LEVEL: Digoxin Level: 1.9 ng/mL (ref 0.8–2.0)

## 2012-07-26 NOTE — Progress Notes (Signed)
Quick Note:  Called. Many rings and no answer. ______ 

## 2012-07-26 NOTE — Telephone Encounter (Signed)
Dig was done on 07/25/2012.

## 2012-07-26 NOTE — Progress Notes (Signed)
Quick Note:  Patient know his digoxin level is normal. Followup with PCP as usual. QM:[id number] M, MD  ______

## 2012-07-30 NOTE — Progress Notes (Signed)
Quick Note:  Pt returned call and was informed. ______ 

## 2012-07-30 NOTE — Progress Notes (Signed)
Quick Note:  LMOM to call. ______ 

## 2012-08-13 ENCOUNTER — Other Ambulatory Visit (HOSPITAL_COMMUNITY): Payer: Self-pay | Admitting: Cardiovascular Disease

## 2012-08-13 DIAGNOSIS — I4891 Unspecified atrial fibrillation: Secondary | ICD-10-CM

## 2012-08-13 DIAGNOSIS — I509 Heart failure, unspecified: Secondary | ICD-10-CM

## 2012-08-13 DIAGNOSIS — I272 Pulmonary hypertension, unspecified: Secondary | ICD-10-CM

## 2012-08-13 DIAGNOSIS — R0989 Other specified symptoms and signs involving the circulatory and respiratory systems: Secondary | ICD-10-CM

## 2012-08-13 DIAGNOSIS — I70219 Atherosclerosis of native arteries of extremities with intermittent claudication, unspecified extremity: Secondary | ICD-10-CM

## 2012-08-13 DIAGNOSIS — Z95810 Presence of automatic (implantable) cardiac defibrillator: Secondary | ICD-10-CM

## 2012-08-24 ENCOUNTER — Encounter (HOSPITAL_COMMUNITY): Payer: Medicare Other

## 2012-08-24 ENCOUNTER — Ambulatory Visit (HOSPITAL_COMMUNITY): Payer: Medicare Other

## 2012-09-10 ENCOUNTER — Encounter: Payer: Self-pay | Admitting: *Deleted

## 2012-09-13 ENCOUNTER — Ambulatory Visit (HOSPITAL_COMMUNITY)
Admission: RE | Admit: 2012-09-13 | Discharge: 2012-09-13 | Disposition: A | Discharge status: Home / Self Care | Payer: Medicare Other | Source: Ambulatory Visit | Attending: Cardiovascular Disease | Admitting: Cardiovascular Disease

## 2012-09-13 ENCOUNTER — Encounter (HOSPITAL_COMMUNITY): Payer: Medicare Other

## 2012-09-13 DIAGNOSIS — R0989 Other specified symptoms and signs involving the circulatory and respiratory systems: Secondary | ICD-10-CM | POA: Insufficient documentation

## 2012-09-13 DIAGNOSIS — I272 Pulmonary hypertension, unspecified: Secondary | ICD-10-CM

## 2012-09-13 DIAGNOSIS — I2789 Other specified pulmonary heart diseases: Secondary | ICD-10-CM | POA: Insufficient documentation

## 2012-09-13 DIAGNOSIS — I4891 Unspecified atrial fibrillation: Secondary | ICD-10-CM

## 2012-09-13 DIAGNOSIS — I509 Heart failure, unspecified: Secondary | ICD-10-CM | POA: Insufficient documentation

## 2012-09-13 DIAGNOSIS — Z95810 Presence of automatic (implantable) cardiac defibrillator: Secondary | ICD-10-CM

## 2012-09-13 NOTE — Progress Notes (Signed)
Carotid Duplex Imaging Complete Jose Mcgrath 

## 2012-09-13 NOTE — Progress Notes (Signed)
2D Echo Performed 09/13/2012    Bracy Pepper, RCS  

## 2012-09-17 ENCOUNTER — Ambulatory Visit (HOSPITAL_COMMUNITY)
Admission: RE | Admit: 2012-09-17 | Discharge: 2012-09-17 | Disposition: A | Discharge status: Home / Self Care | Payer: Medicare Other | Source: Ambulatory Visit | Attending: Cardiovascular Disease | Admitting: Cardiovascular Disease

## 2012-09-17 DIAGNOSIS — I70219 Atherosclerosis of native arteries of extremities with intermittent claudication, unspecified extremity: Secondary | ICD-10-CM | POA: Insufficient documentation

## 2012-09-17 NOTE — Progress Notes (Signed)
ABIs and lower extremity duplex doppler was completed demonstrating multilevel arterial disease with ABIs of R=.78, L= .78 Larene Pickett RVT

## 2012-09-19 ENCOUNTER — Telehealth: Payer: Self-pay

## 2012-09-19 ENCOUNTER — Telehealth: Payer: Self-pay | Admitting: General Practice

## 2012-09-19 NOTE — Telephone Encounter (Signed)
Forwarding to [facility] to Triage

## 2012-09-19 NOTE — Telephone Encounter (Signed)
Called and could not leave a message. ( I have put him on the schedule tentatively for Friday 09/21/2012 at 2:30 PM.)

## 2012-09-19 NOTE — Telephone Encounter (Signed)
EGD MAY 2 OR MAY 5. TRIAGE MEDS.

## 2012-09-19 NOTE — Telephone Encounter (Signed)
Message copied by Jennings Books on Wed Sep 19, 2012 10:51 AM ------      Message from: Corbin Ade      Created: Wed Sep 19, 2012  8:50 AM       Dr. Alanda Amass called me yesterday. Patient took H. pylori treatment but did not continue PPI. Needs Coumadin. Needs relook upper GI tract to assess for ulcer healing (EGD 131/14). Our office tells me patient's not scheduled. Dr. Alanda Amass started back PPI yesterday. Needs EGD ASAP (SLF) to help plan regarding Coumadin. ------

## 2012-09-20 NOTE — Telephone Encounter (Signed)
Called. Many rings and no answer.  

## 2012-09-20 NOTE — Telephone Encounter (Signed)
Mailed letter for pt to call ASAP to schedule EGD.

## 2012-09-20 NOTE — Telephone Encounter (Signed)
Tried again to reach pt. It rang many times and then finally recorder said to enter an access code.

## 2012-09-20 NOTE — Telephone Encounter (Signed)
Took off the [facility] schedule for 09/21/2012 since I have not been able to reach the pt.

## 2012-09-24 NOTE — Telephone Encounter (Signed)
Called. Many rings and then recorder said to enter your access code. Could not leave a message.

## 2012-09-25 ENCOUNTER — Encounter: Payer: Self-pay | Admitting: Gastroenterology

## 2012-09-25 NOTE — Telephone Encounter (Signed)
See separate note. Pt was taken off when we could not contact him.

## 2012-09-27 NOTE — Progress Notes (Signed)
FEB 2014 H PYLORI, 43 COLON POLYPS REMOVED  REVIEWED.

## 2012-10-02 ENCOUNTER — Other Ambulatory Visit: Payer: Self-pay | Admitting: *Deleted

## 2012-10-02 MED ORDER — CARVEDILOL 25 MG PO TABS: 37.5000 mg | ORAL_TABLET | Freq: Two times a day (BID) | ORAL | Status: DC

## 2012-10-30 ENCOUNTER — Telehealth: Payer: Self-pay | Admitting: Cardiovascular Disease

## 2012-10-30 NOTE — Telephone Encounter (Signed)
Patient needs to schedule an appointment. I left a message for patient to call back.

## 2012-10-30 NOTE — Telephone Encounter (Signed)
Pharmacy send electronic refill for Spironolactone 25 mg # 30 on 6/03.   Has not heard back.

## 2012-10-30 NOTE — Telephone Encounter (Signed)
Message forwarded to [facility]

## 2012-11-01 MED ORDER — SPIRONOLACTONE 25 MG PO TABS: 25.0000 mg | ORAL_TABLET | Freq: Every morning | ORAL | Status: DC

## 2012-11-01 NOTE — Telephone Encounter (Addendum)
Spoke with patient. He was last seen on 09/18/2012 in [place], no need to make an appointment at this time. Rx was sent to pharmacy electronically via [employer].

## 2012-11-06 ENCOUNTER — Encounter: Payer: Self-pay | Admitting: Gastroenterology

## 2013-03-11 ENCOUNTER — Other Ambulatory Visit: Payer: Self-pay | Admitting: *Deleted

## 2013-03-11 MED ORDER — DIGOXIN 250 MCG PO TABS: 0.2500 mg | ORAL_TABLET | Freq: Every morning | ORAL | Status: DC

## 2013-03-25 ENCOUNTER — Other Ambulatory Visit: Payer: Self-pay | Admitting: *Deleted

## 2013-03-25 MED ORDER — LOSARTAN POTASSIUM 50 MG PO TABS: 50.0000 mg | ORAL_TABLET | Freq: Every morning | ORAL | Status: DC

## 2013-03-25 NOTE — Telephone Encounter (Signed)
Rx was sent to pharmacy electronically. 

## 2013-05-29 ENCOUNTER — Other Ambulatory Visit: Payer: Self-pay | Admitting: *Deleted

## 2013-05-29 MED ORDER — FUROSEMIDE 40 MG PO TABS: 40.0000 mg | ORAL_TABLET | Freq: Every day | ORAL | Status: DC

## 2013-05-29 MED ORDER — CARVEDILOL 25 MG PO TABS: 37.5000 mg | ORAL_TABLET | Freq: Two times a day (BID) | ORAL | Status: DC

## 2013-06-17 ENCOUNTER — Encounter: Payer: Medicare Other | Admitting: Internal Medicine

## 2013-06-21 ENCOUNTER — Ambulatory Visit (INDEPENDENT_AMBULATORY_CARE_PROVIDER_SITE_OTHER): Payer: Medicare Other | Admitting: Internal Medicine

## 2013-06-21 ENCOUNTER — Encounter: Payer: Self-pay | Admitting: Internal Medicine

## 2013-06-21 ENCOUNTER — Encounter: Payer: Self-pay | Admitting: *Deleted

## 2013-06-21 ENCOUNTER — Ambulatory Visit (HOSPITAL_COMMUNITY)
Admission: RE | Admit: 2013-06-21 | Discharge: 2013-06-21 | Disposition: A | Discharge status: Home / Self Care | Payer: Medicare Other | Source: Ambulatory Visit | Attending: Internal Medicine | Admitting: Internal Medicine

## 2013-06-21 VITALS — BP 130/62 | HR 86 | Ht 74.0 in | Wt 193.8 lb

## 2013-06-21 DIAGNOSIS — Z95810 Presence of automatic (implantable) cardiac defibrillator: Secondary | ICD-10-CM

## 2013-06-21 DIAGNOSIS — Z950 Presence of cardiac pacemaker: Secondary | ICD-10-CM

## 2013-06-21 DIAGNOSIS — I428 Other cardiomyopathies: Secondary | ICD-10-CM

## 2013-06-21 DIAGNOSIS — I4891 Unspecified atrial fibrillation: Secondary | ICD-10-CM

## 2013-06-21 DIAGNOSIS — I509 Heart failure, unspecified: Secondary | ICD-10-CM

## 2013-06-21 DIAGNOSIS — Z01818 Encounter for other preprocedural examination: Secondary | ICD-10-CM

## 2013-06-21 DIAGNOSIS — I482 Chronic atrial fibrillation, unspecified: Secondary | ICD-10-CM

## 2013-06-21 LAB — MDC_IDC_ENUM_SESS_TYPE_INCLINIC
Brady Statistic AP VS Percent: 0 %
Brady Statistic AS VS Percent: 0 %
Date Time Interrogation Session: 20150130115909
HIGH POWER IMPEDANCE MEASURED VALUE: 285 Ohm
HIGH POWER IMPEDANCE MEASURED VALUE: 29 Ohm
HIGH POWER IMPEDANCE MEASURED VALUE: 37 Ohm
HighPow Impedance: 171 Ohm
Lead Channel Impedance Value: 228 Ohm
Lead Channel Impedance Value: 361 Ohm
Lead Channel Impedance Value: 399 Ohm
Lead Channel Impedance Value: 4047 Ohm
Lead Channel Impedance Value: 475 Ohm
Lead Channel Pacing Threshold Amplitude: 0.625 V
Lead Channel Pacing Threshold Amplitude: 4.75 V
Lead Channel Pacing Threshold Pulse Width: 1.2 ms
Lead Channel Sensing Intrinsic Amplitude: 9.125 mV
Lead Channel Sensing Intrinsic Amplitude: 9.375 mV
Lead Channel Sensing Intrinsic Amplitude: 9.375 mV
Lead Channel Setting Pacing Pulse Width: 1.2 ms
Lead Channel Setting Sensing Sensitivity: 0.3 mV
MDC IDC MSMT BATTERY VOLTAGE: 2.61 V
MDC IDC MSMT LEADCHNL RV PACING THRESHOLD PULSEWIDTH: 0.4 ms
MDC IDC SET LEADCHNL LV PACING AMPLITUDE: 5.5 V
MDC IDC SET LEADCHNL RV PACING AMPLITUDE: 2 V
MDC IDC SET LEADCHNL RV PACING PULSEWIDTH: 0.4 ms
MDC IDC SET ZONE DETECTION INTERVAL: 350 ms
MDC IDC SET ZONE DETECTION INTERVAL: 400 ms
MDC IDC STAT BRADY AP VP PERCENT: 0 %
MDC IDC STAT BRADY AS VP PERCENT: 0 %
MDC IDC STAT BRADY RA PERCENT PACED: 0 %
MDC IDC STAT BRADY RV PERCENT PACED: 0 %
Zone Setting Detection Interval: 320 ms
Zone Setting Detection Interval: 360 ms

## 2013-06-21 LAB — BASIC METABOLIC PANEL
BUN: 25 mg/dL — AB (ref 6–23)
CALCIUM: 8.6 mg/dL (ref 8.4–10.5)
CHLORIDE: 103 meq/L (ref 96–112)
CO2: 27 mEq/L (ref 19–32)
CREATININE: 1.07 mg/dL (ref 0.50–1.35)
Glucose, Bld: 114 mg/dL — ABNORMAL HIGH (ref 70–99)
Potassium: 4.3 mEq/L (ref 3.5–5.3)
Sodium: 138 mEq/L (ref 135–145)

## 2013-06-21 NOTE — Progress Notes (Signed)
HPI  [name] is referred today by Dr. Sallyanne Kuster for ongoing evaluation and management of his ICD. He is a very pleasant 73 year old man with multiple medical problems including an ischemic cardiomyopathy, heart block ,chronic atrial fibrillation, status post biventricular ICD implantation, with his current device at elective replacement. He appears to have an elevated left ventricular pacing threshold. The patient also claudication and a hernia, both of which have worsened. He denies any recent ICD shock, but has had appropriate shocks for ventricular tachycardia in the past. He has severe class IIIB congestive heart failure. At least a portion of his dyspnea however is related to COPD. He continues to smoke. No Known Allergies   Current Outpatient Prescriptions  Medication Sig Dispense Refill  . carvedilol (COREG) 25 MG tablet Take 1.5 tablets (37.5 mg total) by mouth 2 (two) times daily with a meal.  90 tablet  6  . digoxin (LANOXIN) 0.25 MG tablet Take 1 tablet (0.25 mg total) by mouth every morning.  30 tablet  6  . furosemide (LASIX) 40 MG tablet Take 60 mg by mouth daily.      [name] losartan (COZAAR) 50 MG tablet Take 1 tablet (50 mg total) by mouth every morning.  30 tablet  6  . spironolactone (ALDACTONE) 25 MG tablet Take 1 tablet (25 mg total) by mouth every morning.  30 tablet  9   No current facility-administered medications for this visit.     Past Medical History  Diagnosis Date  . CAD (coronary artery disease)     CABG 09-04-1997  . ICD (implantable cardiac defibrillator) in place     placed 09-04-05 secondary to left BBB, ischemic cardiomyopathy, permanent afib   . PAD (peripheral artery disease)   . COPD (chronic obstructive pulmonary disease)   . Hyperlipidemia   . Bladder cancer     s/p resection  . Diabetes mellitus     patient says his not a diabetic    ROS:   All systems reviewed and negative except as noted in the HPI.   Past Surgical History    Procedure Laterality Date  . Cardiac defibrillator placement      BiV ICD 2005/09/04 by Dr. Rollene Fare  . Abdominal aortic aneurysm repair    . Cholecystectomy    . Right inguinal hernia    . Transurethral resection of bladder tumor    . Esophagogastroduodenoscopy  06/22/2012     Dr. Oneida Alar: PUD, +H.pylori  . Colonoscopy N/A 06/29/2012    Dr. Oneida Alar: multiple polyps (43)tubular adenomas, colonic AVMs     Family History  Problem Relation Age of Onset  . Colon cancer Sister     DECEASED DEC 09/05/10, metastatic, diagnosed in her late 98s.      History   Social History  . Marital Status: Married    Spouse Name: N/A    Number of Children: N/A  . Years of Education: N/A   Occupational History  . Not on file.   Social History Main Topics  . Smoking status: Current Every Day Smoker -- 1.00 packs/day    Types: Cigarettes  . Smokeless tobacco: Not on file  . Alcohol Use: No     Comment: remote hx of ETOH abuse in his 20s-30s  . Drug Use: No  . Sexual Activity: Not on file   Other Topics Concern  . Not on file   Social History Narrative  . No narrative on file     BP 130/62  Pulse 86  Ht 6' 2" (1.88 m)  Wt 193 lb 12.8 oz (87.907 kg)  BMI 24.87 kg/m2  Physical Exam:  Chronically ill but stable appearing 72-year-old man, who looks older than his stated age, but in NAD HEENT: Unremarkable Neck:  No JVD, no thyromegally Back:  No CVA tenderness Lungs:  Clear with no wheezes, rales, or rhonchi. HEART:  Regular rate rhythm, no murmurs, no rubs, no clicks Abd:  soft, positive bowel sounds, no organomegally, no rebound, no guarding Ext:  2 plus pulses, no edema, no cyanosis, no clubbing Skin:  No rashes no nodules Neuro:  CN II through XII intact, motor grossly intact   DEVICE  Normal device function.  See PaceArt for details.   Assess/Plan: 

## 2013-06-21 NOTE — Assessment & Plan Note (Signed)
The patient's biventricular ICD is at elective replacement. There is a very elevated left ventricular pacing threshold which we did not evaluate today as he was passed elective replacement. He will be scheduled to undergo ICD generator change and likely lead revision in the next few weeks.

## 2013-06-21 NOTE — Patient Instructions (Addendum)
Your physician recommends that you schedule a follow-up appointment in: 3 months with Dr Lovena Le  A chest x-ray takes a picture of the organs and structures inside the chest, including the heart, lungs, and blood vessels. This test can show several things, including, whether the heart is enlarges; whether fluid is building up in the lungs; and whether pacemaker / defibrillator leads are still in place.  Your physician recommends that you return for lab work today. CBC, BMET  Your physician has recommended that you have a pacemaker [place]. A pacemaker is a small device that is placed under the skin of your chest or abdomen to help control abnormal heart rhythms. This device uses electrical pulses to prompt the heart to beat at a normal rate. Pacemakers are used to treat heart rhythms that are too slow. Wire (leads) are attached to the pacemaker that goes into the chambers of you heart. This is done in the hospital and usually requires and overnight stay. Please see the instruction sheet given to you today for more information.

## 2013-06-21 NOTE — Assessment & Plan Note (Signed)
His ventricular rate appears to be well-controlled. He is not on anticoagulation. He has a history of GI bleeding.

## 2013-06-21 NOTE — Assessment & Plan Note (Addendum)
His heart failure symptoms are class IIIB. I do not think he has any left ventricular pacing at this time. Hopefully we can't improve on biventricular pacing. He appears to be euvolemic. No change in his medical therapy for heart failure. I will send the patient for PA and lateral chest x-ray.

## 2013-06-22 LAB — CBC WITH DIFFERENTIAL/PLATELET
BASOS ABS: 0 10*3/uL (ref 0.0–0.1)
Basophils Relative: 0 % (ref 0–1)
EOS ABS: 0.1 10*3/uL (ref 0.0–0.7)
EOS PCT: 2 % (ref 0–5)
HEMATOCRIT: 33.1 % — AB (ref 39.0–52.0)
Hemoglobin: 10.7 g/dL — ABNORMAL LOW (ref 13.0–17.0)
LYMPHS PCT: 15 % (ref 12–46)
Lymphs Abs: 1 10*3/uL (ref 0.7–4.0)
MCH: 24.2 pg — AB (ref 26.0–34.0)
MCHC: 32.3 g/dL (ref 30.0–36.0)
MCV: 74.9 fL — AB (ref 78.0–100.0)
MONO ABS: 1 10*3/uL (ref 0.1–1.0)
Monocytes Relative: 14 % — ABNORMAL HIGH (ref 3–12)
Neutro Abs: 4.6 10*3/uL (ref 1.7–7.7)
Neutrophils Relative %: 69 % (ref 43–77)
Platelets: 200 10*3/uL (ref 150–400)
RBC: 4.42 MIL/uL (ref 4.22–5.81)
RDW: 18.6 % — AB (ref 11.5–15.5)
WBC: 6.7 10*3/uL (ref 4.0–10.5)

## 2013-06-24 ENCOUNTER — Encounter (HOSPITAL_COMMUNITY): Payer: Self-pay | Admitting: Pharmacy Technician

## 2013-07-04 ENCOUNTER — Encounter (HOSPITAL_COMMUNITY)
Admission: RE | Disposition: A | Discharge status: Home / Self Care | Payer: Self-pay | Source: Ambulatory Visit | Attending: Internal Medicine

## 2013-07-04 ENCOUNTER — Ambulatory Visit (HOSPITAL_COMMUNITY): Payer: Medicare Other

## 2013-07-04 ENCOUNTER — Ambulatory Visit (HOSPITAL_COMMUNITY)
Admission: RE | Admit: 2013-07-04 | Discharge: 2013-07-04 | Disposition: A | Discharge status: Home / Self Care | Payer: Medicare Other | Source: Ambulatory Visit | Attending: Internal Medicine | Admitting: Internal Medicine

## 2013-07-04 DIAGNOSIS — F17200 Nicotine dependence, unspecified, uncomplicated: Secondary | ICD-10-CM | POA: Insufficient documentation

## 2013-07-04 DIAGNOSIS — I447 Left bundle-branch block, unspecified: Secondary | ICD-10-CM | POA: Insufficient documentation

## 2013-07-04 DIAGNOSIS — E119 Type 2 diabetes mellitus without complications: Secondary | ICD-10-CM | POA: Insufficient documentation

## 2013-07-04 DIAGNOSIS — I252 Old myocardial infarction: Secondary | ICD-10-CM | POA: Insufficient documentation

## 2013-07-04 DIAGNOSIS — E785 Hyperlipidemia, unspecified: Secondary | ICD-10-CM | POA: Insufficient documentation

## 2013-07-04 DIAGNOSIS — I2589 Other forms of chronic ischemic heart disease: Secondary | ICD-10-CM

## 2013-07-04 DIAGNOSIS — I4892 Unspecified atrial flutter: Secondary | ICD-10-CM | POA: Insufficient documentation

## 2013-07-04 DIAGNOSIS — I4891 Unspecified atrial fibrillation: Secondary | ICD-10-CM | POA: Insufficient documentation

## 2013-07-04 DIAGNOSIS — Z79899 Other long term (current) drug therapy: Secondary | ICD-10-CM | POA: Insufficient documentation

## 2013-07-04 DIAGNOSIS — J449 Chronic obstructive pulmonary disease, unspecified: Secondary | ICD-10-CM | POA: Insufficient documentation

## 2013-07-04 DIAGNOSIS — J4489 Other specified chronic obstructive pulmonary disease: Secondary | ICD-10-CM | POA: Insufficient documentation

## 2013-07-04 DIAGNOSIS — I442 Atrioventricular block, complete: Secondary | ICD-10-CM | POA: Insufficient documentation

## 2013-07-04 DIAGNOSIS — I509 Heart failure, unspecified: Secondary | ICD-10-CM | POA: Insufficient documentation

## 2013-07-04 DIAGNOSIS — Z4502 Encounter for adjustment and management of automatic implantable cardiac defibrillator: Secondary | ICD-10-CM | POA: Insufficient documentation

## 2013-07-04 HISTORY — PX: BIV ICD GENERTAOR CHANGE OUT: SHX5745

## 2013-07-04 LAB — SURGICAL PCR SCREEN
MRSA, PCR: NEGATIVE
STAPHYLOCOCCUS AUREUS: NEGATIVE

## 2013-07-04 SURGERY — BIV ICD GENERTAOR CHANGE OUT
Anesthesia: LOCAL

## 2013-07-04 MED ORDER — MUPIROCIN 2 % EX OINT
TOPICAL_OINTMENT | CUTANEOUS | Status: AC
  Filled 2013-07-04: qty 22

## 2013-07-04 MED ORDER — MIDAZOLAM HCL 5 MG/5ML IJ SOLN
INTRAMUSCULAR | Status: AC
  Filled 2013-07-04: qty 5

## 2013-07-04 MED ORDER — FUROSEMIDE 10 MG/ML IJ SOLN
INTRAMUSCULAR | Status: AC
  Filled 2013-07-04: qty 8

## 2013-07-04 MED ORDER — SODIUM CHLORIDE 0.9 % IR SOLN
80.0000 mg | Status: DC
  Filled 2013-07-04 (×3): qty 2

## 2013-07-04 MED ORDER — FENTANYL CITRATE 0.05 MG/ML IJ SOLN
INTRAMUSCULAR | Status: AC
  Administered 2013-07-04: 25 ug via INTRAVENOUS
  Filled 2013-07-04: qty 2

## 2013-07-04 MED ORDER — ONDANSETRON HCL 4 MG/2ML IJ SOLN: 4.0000 mg | Freq: Four times a day (QID) | INTRAMUSCULAR | Status: DC | PRN

## 2013-07-04 MED ORDER — CHLORHEXIDINE GLUCONATE 4 % EX LIQD
60.0000 mL | Freq: Once | CUTANEOUS | Status: DC
  Filled 2013-07-04: qty 60

## 2013-07-04 MED ORDER — SODIUM CHLORIDE 0.9 % IV SOLN: INTRAVENOUS | Status: DC

## 2013-07-04 MED ORDER — SODIUM CHLORIDE 0.9 % IV SOLN
INTRAVENOUS | Status: DC
  Administered 2013-07-04: 10:00:00 via INTRAVENOUS

## 2013-07-04 MED ORDER — CEFAZOLIN SODIUM-DEXTROSE 2-3 GM-% IV SOLR
2.0000 g | INTRAVENOUS | Status: DC
  Filled 2013-07-04 (×2): qty 50

## 2013-07-04 MED ORDER — FENTANYL CITRATE 0.05 MG/ML IJ SOLN
25.0000 ug | INTRAMUSCULAR | Status: DC | PRN
  Administered 2013-07-04: 25 ug via INTRAVENOUS
  Filled 2013-07-04: qty 2

## 2013-07-04 MED ORDER — LIDOCAINE HCL (PF) 1 % IJ SOLN
INTRAMUSCULAR | Status: AC
  Filled 2013-07-04: qty 60

## 2013-07-04 MED ORDER — MUPIROCIN 2 % EX OINT
TOPICAL_OINTMENT | Freq: Two times a day (BID) | CUTANEOUS | Status: DC
  Administered 2013-07-04: 10:00:00 via NASAL
  Filled 2013-07-04: qty 22

## 2013-07-04 MED ORDER — ACETAMINOPHEN 325 MG PO TABS
325.0000 mg | ORAL_TABLET | ORAL | Status: DC | PRN
  Filled 2013-07-04: qty 2

## 2013-07-04 NOTE — H&P (Signed)
  ICD Criteria  Current LVEF (within 6 months):35%  NYHA Functional Classification: Class III  Heart Failure History:  Yes, Duration of heart failure since onset is > 9 months  Non-Ischemic Dilated Cardiomyopathy History:  No.  Atrial Fibrillation/Atrial Flutter:  Yes, A-Fib/A-Flutter type: Permanent (>1 year).  Ventricular Tachycardia History:  No.  Cardiac Arrest History:  No  History of Syndromes with Risk of Sudden Death:  No.  Previous ICD:  Yes, ICD Type:  CRT-D, Reason for ICD:  Primary prevention.  35%  Electrophysiology Study: No.  Prior MI: Yes, Most recent MI timeframe is > 40 days.  PPM: No.   OSA:  No  Patient Life Expectancy of >=1 year: Yes.  Anticoagulation Therapy:  Patient is NOT on anticoagulation therapy. GI bleeding  Beta Blocker Therapy:  Yes.   Ace Inhibitor/ARB Therapy:  Yes.

## 2013-07-04 NOTE — CV Procedure (Signed)
BiV ICD generator removal, attempted cannulation of the left subclavian vein utilizing left upper extremity contrast venography and insertion of a new BiV ICD with pocket revision without immediate complication. D# [id number].

## 2013-07-04 NOTE — Interval H&P Note (Signed)
History and Physical Interval Note:  07/04/2013 9:36 AM  Jose Mcgrath  has presented today for surgery, with the diagnosis of ERI  The various methods of treatment have been discussed with the patient and family. After consideration of risks, benefits and other options for treatment, the patient has consented to  Procedure(s): BIV ICD [place] (N/A) as a surgical intervention .  The patient's history has been reviewed, patient examined, no change in status, stable for surgery.  I have reviewed the patient's chart and labs.  Questions were answered to the patient's satisfaction.     Mikle Bosworth.D.

## 2013-07-04 NOTE — H&P (View-Only) (Signed)
HPI  [name] is referred today by Dr. Sallyanne Kuster for ongoing evaluation and management of his ICD. He is a very pleasant 73 year old man with multiple medical problems including an ischemic cardiomyopathy, heart block ,chronic atrial fibrillation, status post biventricular ICD implantation, with his current device at elective replacement. He appears to have an elevated left ventricular pacing threshold. The patient also claudication and a hernia, both of which have worsened. He denies any recent ICD shock, but has had appropriate shocks for ventricular tachycardia in the past. He has severe class IIIB congestive heart failure. At least a portion of his dyspnea however is related to COPD. He continues to smoke. No Known Allergies   Current Outpatient Prescriptions  Medication Sig Dispense Refill  . carvedilol (COREG) 25 MG tablet Take 1.5 tablets (37.5 mg total) by mouth 2 (two) times daily with a meal.  90 tablet  6  . digoxin (LANOXIN) 0.25 MG tablet Take 1 tablet (0.25 mg total) by mouth every morning.  30 tablet  6  . furosemide (LASIX) 40 MG tablet Take 60 mg by mouth daily.      [name] losartan (COZAAR) 50 MG tablet Take 1 tablet (50 mg total) by mouth every morning.  30 tablet  6  . spironolactone (ALDACTONE) 25 MG tablet Take 1 tablet (25 mg total) by mouth every morning.  30 tablet  9   No current facility-administered medications for this visit.     Past Medical History  Diagnosis Date  . CAD (coronary artery disease)     CABG 09-04-1997  . ICD (implantable cardiac defibrillator) in place     placed 09-04-05 secondary to left BBB, ischemic cardiomyopathy, permanent afib   . PAD (peripheral artery disease)   . COPD (chronic obstructive pulmonary disease)   . Hyperlipidemia   . Bladder cancer     s/p resection  . Diabetes mellitus     patient says his not a diabetic    ROS:   All systems reviewed and negative except as noted in the HPI.   Past Surgical History    Procedure Laterality Date  . Cardiac defibrillator placement      BiV ICD 2005/09/04 by Dr. Rollene Fare  . Abdominal aortic aneurysm repair    . Cholecystectomy    . Right inguinal hernia    . Transurethral resection of bladder tumor    . Esophagogastroduodenoscopy  06/22/2012     Dr. Oneida Alar: PUD, +H.pylori  . Colonoscopy N/A 06/29/2012    Dr. Oneida Alar: multiple polyps (43)tubular adenomas, colonic AVMs     Family History  Problem Relation Age of Onset  . Colon cancer Sister     DECEASED DEC 09/05/10, metastatic, diagnosed in her late 98s.      History   Social History  . Marital Status: Married    Spouse Name: N/A    Number of Children: N/A  . Years of Education: N/A   Occupational History  . Not on file.   Social History Main Topics  . Smoking status: Current Every Day Smoker -- 1.00 packs/day    Types: Cigarettes  . Smokeless tobacco: Not on file  . Alcohol Use: No     Comment: remote hx of ETOH abuse in his 20s-30s  . Drug Use: No  . Sexual Activity: Not on file   Other Topics Concern  . Not on file   Social History Narrative  . No narrative on file     BP 130/62  Pulse 86  Ht 6\' 2"  (1.88 m)  Wt 193 lb 12.8 oz (87.907 kg)  BMI 24.87 kg/m2  Physical Exam:  Chronically ill but stable appearing 73 year old man, who looks older than his stated age, but in NAD HEENT: Unremarkable Neck:  No JVD, no thyromegally Back:  No CVA tenderness Lungs:  Clear with no wheezes, rales, or rhonchi. HEART:  Regular rate rhythm, no murmurs, no rubs, no clicks Abd:  soft, positive bowel sounds, no organomegally, no rebound, no guarding Ext:  2 plus pulses, no edema, no cyanosis, no clubbing Skin:  No rashes no nodules Neuro:  CN II through XII intact, motor grossly intact   DEVICE  Normal device function.  See PaceArt for details.   Assess/Plan:

## 2013-07-04 NOTE — Discharge Instructions (Signed)
Pacemaker Battery Change, Care After  °Refer to this sheet in the next few weeks. These instructions provide you with information on caring for yourself after your procedure. Your health care provider may also give you more specific instructions. Your treatment has been planned according to current medical practices, but problems sometimes occur. Call your health care provider if you have any problems or questions after your procedure. °WHAT TO EXPECT AFTER THE PROCEDURE °After your procedure, it is typical to have the following sensations: °· Soreness at the pacemaker site. °HOME CARE INSTRUCTIONS  °· Keep the incision clean and dry. °· Unless advised otherwise, you may shower beginning 48 hours after your procedure. °· For the first week after the replacement, avoid stretching motions that pull at the incision site and avoid heavy exercise with the arm on the same side as the incision. °· Only take over-the-counter or prescription medicines for pain, discomfort, or fever as directed by your health care provider. °· Your health care provider will tell you when you will need to next test your pacemaker by telephone or when to return to the office for follow up for removal of stitches. °SEEK MEDICAL CARE IF:  °· You have pain at the incision site that is not relieved by over-the-counter or prescription medicine. °· There is drainage or pus from the incision site. °· There is swelling larger than a lime at the incision site. °· You develop red streaking that extends above or below the incision site. °· You feel brief, intermittent palpitations, lightheadedness, or any symptoms that you feel might be related to your heart. °SEEK IMMEDIATE MEDICAL CARE IF:  °· You experience chest pain that is different than the pain at the pacemaker site. °· Shortness of breath. °· Palpitations or irregular heart beat. °· Lightheadedness that does not go away quickly. °· Fainting. °· You have pain that gets worse and is not relieved by  medicine. °MAKE SURE YOU:  °· Understand these instructions. °· Will watch your condition. °· Will get help right away if you are not doing well or get worse. °Document Released: 02/27/2013 Document Reviewed: 11/21/2012 °[employer]® Patient Information ©2014 [employer]. ° °

## 2013-07-04 NOTE — Interval H&P Note (Signed)
History and Physical Interval Note:  07/04/2013 10:54 AM  Jose Mcgrath  has presented today for surgery, with the diagnosis of ERI  The various methods of treatment have been discussed with the patient and family. After consideration of risks, benefits and other options for treatment, the patient has consented to  Procedure(s): BIV ICD [place] (N/A) and possible LV lead revision with pocket revision as a surgical intervention .  The patient's history has been reviewed, patient examined, no change in status, stable for surgery.  I have reviewed the patient's chart and labs.  Questions were answered to the patient's satisfaction.     [name]

## 2013-07-05 NOTE — Op Note (Signed)
NAMEMarland Kitchen  Jose Mcgrath, Jose Mcgrath NO.:  [id number]  MEDICAL RECORD NO.:  [id number]  LOCATION:  [facility]                         FACILITY:  [facility]  PHYSICIAN:  Champ Mungo. Lovena Le, MD    DATE OF BIRTH:  [birth year]  DATE OF PROCEDURE:  07/04/2013 DATE OF DISCHARGE:  07/04/2013                              OPERATIVE REPORT   PROCEDURE PERFORMED:  Removal of previously implanted Medtronic biventricular implantable cardioverter-defibrillator which had reached elective replacement, followed by revision of the pacemaker pocket to a subpectoral location, preceded by contrast venography of the left upper extremity venous system.  INTRODUCTION:  The patient is a 73 year old man with an ischemic cardiomyopathy, complete heart block, chronic atrial fibrillation, status post biventricular ICD implantation.  His device has reached elective replacement.  He has chronically elevated left ventricular pacing threshold.  The patient presents today to have his old device removed and if possible, insert a new left ventricular lead.  DESCRIPTION OF PROCEDURE:  After informed consent was obtained, the patient was taken to the diagnostic EP lab in a fasting state.  After usual preparation and draping, intravenous fentanyl and midazolam were given for sedation.  A 10 mL of contrast was injected into the left upper extremity venous system which demonstrated that the left subclavian vein was occluded.  Initial attempts to puncture the vein and advance a Glidewire past the occlusion were unsuccessful.  At this point, electrocautery was utilized to dissect down to the ICD pocket. There was very dense fibrous adhesions and scar tissue.  The generator was removed with gentle traction.  The leads were freed up with electrocautery.  The subcutaneous pocket was revised and blunt dissection was utilized to dissect down to the pectoralis minor.  A space was created underneath the pectoralis major and the top  of pectoralis minor.  The pocket was irrigated.  The old defibrillator was removed and the new Medtronic biventricular ICD, serial #[id number] was connected to the old leads and placed back in the subpectoral pocket. The pocket was irrigated with antibiotic irrigation.  The pectoralis major was reapproximated and the subcutaneous tissues were sewn back together.  Pressure dressing was placed.  Benzoin and Steri-Strips were placed on the skin, and the patient was returned to his room in satisfactory condition.  COMPLICATIONS:  There were no immediate procedure complications.  RESULTS:  This demonstrate successful removal of a previously implanted Medtronic dual-chamber ICD in a patient with complete heart block, chronic atrial fibrillation, and pacing-induced left bundle-branch block status post biventricular ICD implantation with the device at elective replacement.     Champ Mungo. Lovena Le, MD    GWT/MEDQ  D:  07/04/2013  T:  07/05/2013  Job:  [id number]

## 2013-07-09 ENCOUNTER — Encounter (HOSPITAL_COMMUNITY): Payer: Self-pay | Admitting: *Deleted

## 2013-07-11 ENCOUNTER — Telehealth: Payer: Self-pay | Admitting: *Deleted

## 2013-07-11 NOTE — Telephone Encounter (Signed)
Dr Lovena Le wanted Dr Gwenlyn Found to discuss claudication issues with Mr Babington.  I lmom for patient.

## 2013-07-15 ENCOUNTER — Ambulatory Visit (INDEPENDENT_AMBULATORY_CARE_PROVIDER_SITE_OTHER): Payer: Medicare Other | Admitting: *Deleted

## 2013-07-15 DIAGNOSIS — I428 Other cardiomyopathies: Secondary | ICD-10-CM

## 2013-07-15 LAB — MDC_IDC_ENUM_SESS_TYPE_INCLINIC
Battery Remaining Longevity: 37 mo
Battery Voltage: 3.09 V
Brady Statistic AP VP Percent: 0 %
Brady Statistic AP VS Percent: 0 %
Brady Statistic AS VP Percent: 94.9 %
Brady Statistic RV Percent Paced: 89.97 %
Date Time Interrogation Session: 20150223104448
HIGH POWER IMPEDANCE MEASURED VALUE: 190 Ohm
HIGH POWER IMPEDANCE MEASURED VALUE: 29 Ohm
HighPow Impedance: 39 Ohm
Lead Channel Impedance Value: 171 Ohm
Lead Channel Impedance Value: 399 Ohm
Lead Channel Impedance Value: 4047 Ohm
Lead Channel Impedance Value: 418 Ohm
Lead Channel Pacing Threshold Amplitude: 4.5 V
Lead Channel Pacing Threshold Pulse Width: 0.8 ms
Lead Channel Setting Pacing Amplitude: 5.5 V
Lead Channel Setting Pacing Pulse Width: 0.8 ms
Lead Channel Setting Sensing Sensitivity: 0.3 mV
MDC IDC MSMT LEADCHNL LV IMPEDANCE VALUE: 361 Ohm
MDC IDC MSMT LEADCHNL RV PACING THRESHOLD AMPLITUDE: 0.75 V
MDC IDC MSMT LEADCHNL RV PACING THRESHOLD PULSEWIDTH: 0.4 ms
MDC IDC MSMT LEADCHNL RV SENSING INTR AMPL: 10.375 mV
MDC IDC SET LEADCHNL RV PACING AMPLITUDE: 2 V
MDC IDC SET LEADCHNL RV PACING PULSEWIDTH: 0.4 ms
MDC IDC SET ZONE DETECTION INTERVAL: 360 ms
MDC IDC SET ZONE DETECTION INTERVAL: 400 ms
MDC IDC STAT BRADY AS VS PERCENT: 5.1 %
MDC IDC STAT BRADY RA PERCENT PACED: 0 %
Zone Setting Detection Interval: 320 ms
Zone Setting Detection Interval: 350 ms

## 2013-07-15 NOTE — Progress Notes (Signed)
Wound check appointment. Large hematoma noted. Chronic A-fib, - coumadin.   Steri strips intact.   Normal device function. Thresholds, sensing, and impedances consistent with implant measurements. Device programmed at 3.5V for extra safety margin until 3 month visit. Histogram distribution appropriate for patient and level of activity. No ventricular arrhythmias noted.  LV threshold chronically high.   Patient educated about wound care, arm mobility, lifting restrictions, shock plan.  ROV in 1 week for a recheck of his wound.

## 2013-07-17 NOTE — Telephone Encounter (Signed)
lmom 

## 2013-07-22 ENCOUNTER — Other Ambulatory Visit: Payer: Self-pay | Admitting: Gastroenterology

## 2013-07-22 NOTE — Telephone Encounter (Signed)
I spoke with patient and made appt to see Dr Gwenlyn Found

## 2013-07-22 NOTE — Telephone Encounter (Signed)
Pt's wife was returning your call.

## 2013-07-25 ENCOUNTER — Encounter: Payer: Self-pay | Admitting: Cardiovascular Disease

## 2013-07-25 ENCOUNTER — Ambulatory Visit (INDEPENDENT_AMBULATORY_CARE_PROVIDER_SITE_OTHER): Payer: Medicare Other | Admitting: Cardiovascular Disease

## 2013-07-25 ENCOUNTER — Ambulatory Visit: Payer: Medicare Other

## 2013-07-25 VITALS — BP 124/60 | HR 74 | Ht 74.0 in | Wt 191.2 lb

## 2013-07-25 DIAGNOSIS — R0989 Other specified symptoms and signs involving the circulatory and respiratory systems: Secondary | ICD-10-CM

## 2013-07-25 DIAGNOSIS — K439 Ventral hernia without obstruction or gangrene: Secondary | ICD-10-CM

## 2013-07-25 DIAGNOSIS — I739 Peripheral vascular disease, unspecified: Secondary | ICD-10-CM

## 2013-07-25 DIAGNOSIS — Z95810 Presence of automatic (implantable) cardiac defibrillator: Secondary | ICD-10-CM

## 2013-07-25 DIAGNOSIS — I2510 Atherosclerotic heart disease of native coronary artery without angina pectoris: Secondary | ICD-10-CM

## 2013-07-25 DIAGNOSIS — I4891 Unspecified atrial fibrillation: Secondary | ICD-10-CM

## 2013-07-25 DIAGNOSIS — R0602 Shortness of breath: Secondary | ICD-10-CM

## 2013-07-25 NOTE — Assessment & Plan Note (Signed)
The patient has progressive bilateral Lescol and claudication. His left lower extremity arterial Doppler studies performed 09/17/12 revealed ABIs of 0.78 bilaterally with SFA and tibial vessel disease. I'm going to repeat his lower extremity arterial Doppler studies and we will discuss angiography and potential percutaneous intervention

## 2013-07-25 NOTE — Patient Instructions (Addendum)
  We will see you back in follow up after the tests.   Dr Gwenlyn Found has ordered an echocardiogram, carotid dopplers, and lower extremity dopplers.   Dr Gwenlyn Found recommends Dr Tera Helper for your hernia surgery.  We made the referral for you.   I have also provided you with an order for a wheelchair to help with mobility due to leg pain and weakness.

## 2013-07-25 NOTE — Assessment & Plan Note (Signed)
History of aortobifemoral bypass grafting by Dr. Erskin Burnet in 1999

## 2013-07-25 NOTE — Assessment & Plan Note (Signed)
Patient has a history of chronic A. Fib. He was on Coumadin anticoagulation in the past which was discontinued because of a GI bleed probably related to AVMs of the colon.

## 2013-07-25 NOTE — Assessment & Plan Note (Signed)
History of coronary artery bypass grafting 1999. His last Myoview stress test performed 02/03/10 was nonischemic with LV dilatation and inferior scar

## 2013-07-25 NOTE — Progress Notes (Signed)
07/25/2013 Jose Mcgrath   November 27, 1940  [id number]  Primary Physician Leonides Grills, MD Primary Cardiologist: Lorretta Harp MD Renae Gloss   HPI:  [name] is a 73 year old chronically ill-appearing married Caucasian male father of one living daughter who I am seeing because of claudication. He was previously a patient of Dr. Terance Ice.he recently had a ICD generator change by Dr. Crissie Sickles. He has had a previously placed a biventricular ICD by Dr. Rollene Fare. His other problems include history of CAD status post coronary artery bypass grafting in 1999 for multivessel disease. He has ischemic cardiomyopathy. He has also had abdominal aortic aneurysm resected and grafted by Dr. Erskin Burnet in 1999. His other problems include continued tobacco abuse with COPD, a bolus of diabetes, hyperlipidemia. He has chronic atrial fibrillation and was on Coumadin anticoagulation in the past which was stopped because of GI bleed. He has progressive bilateral lower extremity lifestyle limiting claudication over the last year with Dopplers performed one year ago that showed SFA and tibial vessel disease.   Current Outpatient Prescriptions  Medication Sig Dispense Refill  . carvedilol (COREG) 25 MG tablet Take 37.5 mg by mouth 2 (two) times daily with a meal.      . digoxin (LANOXIN) 0.25 MG tablet Take 0.25 mg by mouth every morning.      . furosemide (LASIX) 40 MG tablet Take 60 mg by mouth daily.      [name] losartan (COZAAR) 50 MG tablet Take 50 mg by mouth every morning.      [name] omeprazole (PRILOSEC) 20 MG capsule TAKE ONE (1) CAPSULE BY MOUTH TWICE A DAY. (EVERY 12 HOURS.)  60 capsule  5  . spironolactone (ALDACTONE) 25 MG tablet Take 25 mg by mouth every morning.       No current facility-administered medications for this visit.    No Known Allergies  History   Social History  . Marital Status: Married    Spouse Name: N/A    Number of Children: N/A  . Years of  Education: N/A   Occupational History  . Not on file.   Social History Main Topics  . Smoking status: Current Every Day Smoker -- 1.00 packs/day    Types: Cigarettes  . Smokeless tobacco: Never Used  . Alcohol Use: No     Comment: remote hx of ETOH abuse in his 20s-30s  . Drug Use: No  . Sexual Activity: Not on file   Other Topics Concern  . Not on file   Social History Narrative  . No narrative on file     Review of Systems: General: negative for chills, fever, night sweats or weight changes.  Cardiovascular: negative for chest pain, dyspnea on exertion, edema, orthopnea, palpitations, paroxysmal nocturnal dyspnea or shortness of breath Dermatological: negative for rash Respiratory: negative for cough or wheezing Urologic: negative for hematuria Abdominal: negative for nausea, vomiting, diarrhea, bright red blood per rectum, melena, or hematemesis Neurologic: negative for visual changes, syncope, or dizziness All other systems reviewed and are otherwise negative except as noted above.    Blood pressure 124/60, pulse 74, height 6\' 2"  (1.88 m), weight 86.728 kg (191 lb 3.2 oz).  General appearance: alert and no distress Neck: no adenopathy, no JVD, supple, symmetrical, trachea midline, thyroid not enlarged, symmetric, no tenderness/mass/nodules and loud bilateral carotid bruits Lungs: clear to auscultation bilaterally Heart: regular rate and rhythm, S1, S2 normal, no murmur, click, rub or gallop Abdomen: there is a large ventral hernia  present. Extremities: extremities normal, atraumatic, no cyanosis or edema and absent pedal pulses  EKG ventricular paced rhythm  ASSESSMENT AND PLAN:   Abdominal aortic aneurysm History of aortobifemoral bypass grafting by Dr. Erskin Burnet in 1999  CAD (coronary artery disease) History of coronary artery bypass grafting 1999. His last Myoview stress test performed 02/03/10 was nonischemic with LV dilatation and inferior scar  Atrial  fibrillation Patient has a history of chronic A. Fib. He was on Coumadin anticoagulation in the past which was discontinued because of a GI bleed probably related to AVMs of the colon.  Claudication The patient has progressive bilateral Lescol and claudication. His left lower extremity arterial Doppler studies performed 09/17/12 revealed ABIs of 0.78 bilaterally with SFA and tibial vessel disease. I'm going to repeat his lower extremity arterial Doppler studies and we will discuss angiography and potential percutaneous intervention   Patient has the ability limitations that impair his ability to one or more mobility related activities of daily living due to his weakness and his claudication. Patient cannot use a cane, crutches or walker to his office. The patient can safely propel the wheelchair at home or has a caregiver that can provide assistance   Lorretta Harp MD [place], [facility] 07/25/2013 4:36 PM

## 2013-07-26 ENCOUNTER — Telehealth: Payer: Self-pay | Admitting: Cardiovascular Disease

## 2013-07-26 NOTE — Telephone Encounter (Signed)
Left msg for patient to call and schedule f/u appt with dr. Gwenlyn Found after his tests.

## 2013-08-01 ENCOUNTER — Telehealth: Payer: Self-pay | Admitting: *Deleted

## 2013-08-01 NOTE — Telephone Encounter (Signed)
Patient has to be referred to surgeons about his ventral hernia from his pcp per [facility]s.

## 2013-08-02 NOTE — Telephone Encounter (Signed)
Returning your call from yesterday .[name] Please call   Thanks

## 2013-08-05 NOTE — Telephone Encounter (Signed)
I called patient and spoke with his wife.  I explained that surgical referral had to come from primary MD.  She voiced understanding and will contact primary MD for referral.

## 2013-08-06 ENCOUNTER — Ambulatory Visit (HOSPITAL_COMMUNITY)
Admission: RE | Admit: 2013-08-06 | Discharge: 2013-08-06 | Disposition: A | Discharge status: Home / Self Care | Payer: Medicare Other | Source: Ambulatory Visit | Attending: Cardiovascular Disease | Admitting: Cardiovascular Disease

## 2013-08-06 DIAGNOSIS — R0989 Other specified symptoms and signs involving the circulatory and respiratory systems: Secondary | ICD-10-CM | POA: Insufficient documentation

## 2013-08-06 DIAGNOSIS — I059 Rheumatic mitral valve disease, unspecified: Secondary | ICD-10-CM

## 2013-08-06 DIAGNOSIS — R0602 Shortness of breath: Secondary | ICD-10-CM

## 2013-08-06 NOTE — Progress Notes (Signed)
Carotid Duplex Completed. °Brianna L Mazza,RVT °

## 2013-08-09 ENCOUNTER — Encounter (HOSPITAL_COMMUNITY): Payer: Medicare Other

## 2013-08-11 ENCOUNTER — Telehealth: Payer: Self-pay | Admitting: *Deleted

## 2013-08-11 ENCOUNTER — Encounter: Payer: Self-pay | Admitting: *Deleted

## 2013-08-11 DIAGNOSIS — I6529 Occlusion and stenosis of unspecified carotid artery: Secondary | ICD-10-CM

## 2013-08-11 NOTE — Telephone Encounter (Signed)
Order placed for repeat carotid dopplers in 1 year  

## 2013-08-11 NOTE — Telephone Encounter (Signed)
Message copied by Chauncy Lean on Sun Aug 11, 2013  7:22 PM ------      Message from: Lorretta Harp      Created: Sat Aug 10, 2013  2:42 PM       No change from prior study. Repeat in 12 months. ------

## 2013-08-12 ENCOUNTER — Encounter: Payer: Self-pay | Admitting: *Deleted

## 2013-08-13 ENCOUNTER — Encounter: Payer: Self-pay | Admitting: Cardiovascular Disease

## 2013-08-13 ENCOUNTER — Ambulatory Visit (HOSPITAL_COMMUNITY)
Admission: RE | Admit: 2013-08-13 | Discharge: 2013-08-13 | Disposition: A | Discharge status: Home / Self Care | Payer: Medicare Other | Source: Ambulatory Visit | Attending: Cardiovascular Disease | Admitting: Cardiovascular Disease

## 2013-08-13 ENCOUNTER — Ambulatory Visit (INDEPENDENT_AMBULATORY_CARE_PROVIDER_SITE_OTHER): Payer: Medicare Other | Admitting: Cardiovascular Disease

## 2013-08-13 VITALS — BP 138/82 | HR 84 | Ht 74.0 in | Wt 186.6 lb

## 2013-08-13 DIAGNOSIS — I999 Unspecified disorder of circulatory system: Secondary | ICD-10-CM

## 2013-08-13 DIAGNOSIS — I70229 Atherosclerosis of native arteries of extremities with rest pain, unspecified extremity: Secondary | ICD-10-CM | POA: Insufficient documentation

## 2013-08-13 DIAGNOSIS — I998 Other disorder of circulatory system: Secondary | ICD-10-CM | POA: Insufficient documentation

## 2013-08-13 DIAGNOSIS — I739 Peripheral vascular disease, unspecified: Secondary | ICD-10-CM

## 2013-08-13 DIAGNOSIS — Z79899 Other long term (current) drug therapy: Secondary | ICD-10-CM

## 2013-08-13 DIAGNOSIS — D689 Coagulation defect, unspecified: Secondary | ICD-10-CM

## 2013-08-13 DIAGNOSIS — Z01818 Encounter for other preprocedural examination: Secondary | ICD-10-CM

## 2013-08-13 NOTE — Assessment & Plan Note (Signed)
Patient has a history of peripheral arterial disease. He's had aortobifemoral bypass grafting in the past. Lower extremity arterial Doppler studies performed one year ago revealed ABIs of 0.70 bilaterally with moderate SFA disease and one vessel runoff. Because of worsening claudication as well as a new right heel ulcer Dopplers were performed today revealing occluded SFAs bilaterally. He would angiography to determine his anatomy and assist in planning a revascularization strategy.

## 2013-08-13 NOTE — Progress Notes (Signed)
08/13/2013 Jose Mcgrath   [birth year]  [id number]  Primary Physician Leonides Grills, MD Primary Cardiologist: Lorretta Harp MD Renae Gloss   HPI:  Jose Mcgrath is a 73 year old chronically ill-appearing married Caucasian male father of one living daughter who I am seeing because of claudication. He was previously a patient of Dr. Terance Mcgrath.he recently had a ICD generator change by Dr. Crissie Mcgrath. He has had a previously placed a biventricular ICD by Dr. Rollene Mcgrath. His other problems include history of CAD status post coronary artery bypass grafting in 1999 for multivessel disease. He has ischemic cardiomyopathy. He has also had abdominal aortic aneurysm resected and grafted by Dr. Erskin Mcgrath in 1999. His other problems include continued tobacco abuse with COPD, a bolus of diabetes, hyperlipidemia. He has chronic atrial fibrillation and was on Coumadin anticoagulation in the past which was stopped because of GI bleed. He has progressive bilateral lower extremity lifestyle limiting claudication over the last year with Dopplers performed one year ago that showed SFA and tibial vessel disease. Dopplers performed today revealed occluded SFAs bilaterally. He does have 1 vessel runoff and a progressive painful right heel ulcer. I'm going to arrange for him to undergo angiography via left femoral approach to define his anatomy and help strategize a revascularization strategy for limb salvage    Current Outpatient Prescriptions  Medication Sig Dispense Refill  . carvedilol (COREG) 25 MG tablet Take 37.5 mg by mouth 2 (two) times daily with a meal.      . digoxin (LANOXIN) 0.25 MG tablet Take 0.25 mg by mouth every morning.      . furosemide (LASIX) 40 MG tablet Take 60 mg by mouth daily.      [name] losartan (COZAAR) 50 MG tablet Take 50 mg by mouth every morning.      [name] omeprazole (PRILOSEC) 20 MG capsule TAKE ONE (1) CAPSULE BY MOUTH TWICE A DAY. (EVERY 12 HOURS.)  60 capsule   5  . spironolactone (ALDACTONE) 25 MG tablet Take 25 mg by mouth every morning.       No current facility-administered medications for this visit.    No Known Allergies  History   Social History  . Marital Status: Married    Spouse Name: N/A    Number of Children: N/A  . Years of Education: N/A   Occupational History  . Not on file.   Social History Main Topics  . Smoking status: Current Every Day Smoker -- 1.00 packs/day    Types: Cigarettes  . Smokeless tobacco: Never Used  . Alcohol Use: No     Comment: remote hx of ETOH abuse in his 20s-30s  . Drug Use: No  . Sexual Activity: Not on file   Other Topics Concern  . Not on file   Social History Narrative  . No narrative on file     Review of Systems: General: negative for chills, fever, night sweats or weight changes.  Cardiovascular: negative for chest pain, dyspnea on exertion, edema, orthopnea, palpitations, paroxysmal nocturnal dyspnea or shortness of breath Dermatological: negative for rash Respiratory: negative for cough or wheezing Urologic: negative for hematuria Abdominal: negative for nausea, vomiting, diarrhea, bright red blood per rectum, melena, or hematemesis Neurologic: negative for visual changes, syncope, or dizziness All other systems reviewed and are otherwise negative except as noted above.    Blood pressure 138/82, pulse 84, height 6\' 2"  (1.88 m), weight 186 lb 9.6 oz (84.641 kg).  General appearance: alert and no  distress Neck: no adenopathy, no JVD, supple, symmetrical, trachea midline, thyroid not enlarged, symmetric, no tenderness/mass/nodules and soft right carotid bruit Lungs: clear to auscultation bilaterally Heart: irregularly irregular rhythm Extremities: present 2 cm gangrenous appearing right heel ulcer  EKG not performed today  ASSESSMENT AND PLAN:   Critical lower limb ischemia Patient has a history of peripheral arterial disease. He's had aortobifemoral bypass grafting in  the past. Lower extremity arterial Doppler studies performed one year ago revealed ABIs of 0.70 bilaterally with moderate SFA disease and one vessel runoff. Because of worsening claudication as well as a new right heel ulcer Dopplers were performed today revealing occluded SFAs bilaterally. He would angiography to determine his anatomy and assist in planning a revascularization strategy.      Lorretta Harp MD FACP,FACC,FAHA, [facility] 08/13/2013 12:13 PM

## 2013-08-13 NOTE — Patient Instructions (Signed)
Dr. Gwenlyn Found has ordered a peripheral angiogram to be done at [facility].  This procedure is going to look at the bloodflow in your lower extremities.  If Dr. Gwenlyn Found is able to open up the arteries, you will have to spend one night in the hospital.  If he is not able to open the arteries, you will be able to go home that same day.    After the procedure, you will not be allowed to drive for 3 days or push, pull, or lift anything greater than 10 lbs for one week.    You will be required to have the following tests prior to the procedure:  1. Blood work-the blood work can be done no more than 7 days prior to the procedure.  It can be done at any [facility] lab.  There is one downstairs on the first floor of this building and one in the [place] ([address])      *REPS: None Left groin stick

## 2013-08-13 NOTE — Progress Notes (Signed)
Lower Extremity Arterial Duplex Completed. °Brianna L Mazza,RVT °

## 2013-08-19 ENCOUNTER — Encounter (HOSPITAL_COMMUNITY): Payer: Self-pay | Admitting: Pharmacy Technician

## 2013-08-23 ENCOUNTER — Ambulatory Visit: Payer: Medicare Other | Admitting: Cardiovascular Disease

## 2013-08-27 LAB — CBC
HCT: 34.1 % — ABNORMAL LOW (ref 39.0–52.0)
Hemoglobin: 11.2 g/dL — ABNORMAL LOW (ref 13.0–17.0)
MCH: 25.5 pg — ABNORMAL LOW (ref 26.0–34.0)
MCHC: 32.8 g/dL (ref 30.0–36.0)
MCV: 77.7 fL — ABNORMAL LOW (ref 78.0–100.0)
PLATELETS: 179 10*3/uL (ref 150–400)
RBC: 4.39 MIL/uL (ref 4.22–5.81)
RDW: 17.7 % — ABNORMAL HIGH (ref 11.5–15.5)
WBC: 5.6 10*3/uL (ref 4.0–10.5)

## 2013-08-27 LAB — BASIC METABOLIC PANEL
BUN: 27 mg/dL — ABNORMAL HIGH (ref 6–23)
CO2: 27 mEq/L (ref 19–32)
Calcium: 9 mg/dL (ref 8.4–10.5)
Chloride: 102 mEq/L (ref 96–112)
Creat: 1.11 mg/dL (ref 0.50–1.35)
Glucose, Bld: 117 mg/dL — ABNORMAL HIGH (ref 70–99)
POTASSIUM: 4.3 meq/L (ref 3.5–5.3)
Sodium: 137 mEq/L (ref 135–145)

## 2013-08-27 LAB — PROTIME-INR
INR: 1.31 (ref <1.50)
Prothrombin Time: 16.1 seconds — ABNORMAL HIGH (ref 11.6–15.2)

## 2013-08-27 LAB — APTT: aPTT: 36 seconds (ref 24–37)

## 2013-08-30 ENCOUNTER — Encounter: Payer: Medicare Other | Admitting: Internal Medicine

## 2013-09-02 ENCOUNTER — Encounter (HOSPITAL_COMMUNITY)
Admission: RE | Disposition: A | Discharge status: Home / Self Care | Payer: Self-pay | Source: Ambulatory Visit | Attending: Cardiovascular Disease

## 2013-09-02 ENCOUNTER — Encounter: Payer: Self-pay | Admitting: *Deleted

## 2013-09-02 ENCOUNTER — Ambulatory Visit (HOSPITAL_COMMUNITY)
Admission: RE | Admit: 2013-09-02 | Discharge: 2013-09-02 | Disposition: A | Discharge status: Home / Self Care | Payer: Medicare Other | Source: Ambulatory Visit | Attending: Cardiovascular Disease | Admitting: Cardiovascular Disease

## 2013-09-02 DIAGNOSIS — E119 Type 2 diabetes mellitus without complications: Secondary | ICD-10-CM | POA: Insufficient documentation

## 2013-09-02 DIAGNOSIS — Z79899 Other long term (current) drug therapy: Secondary | ICD-10-CM | POA: Insufficient documentation

## 2013-09-02 DIAGNOSIS — Z01818 Encounter for other preprocedural examination: Secondary | ICD-10-CM

## 2013-09-02 DIAGNOSIS — I70219 Atherosclerosis of native arteries of extremities with intermittent claudication, unspecified extremity: Secondary | ICD-10-CM

## 2013-09-02 DIAGNOSIS — I4891 Unspecified atrial fibrillation: Secondary | ICD-10-CM | POA: Insufficient documentation

## 2013-09-02 DIAGNOSIS — J4489 Other specified chronic obstructive pulmonary disease: Secondary | ICD-10-CM | POA: Insufficient documentation

## 2013-09-02 DIAGNOSIS — Z951 Presence of aortocoronary bypass graft: Secondary | ICD-10-CM | POA: Insufficient documentation

## 2013-09-02 DIAGNOSIS — I2510 Atherosclerotic heart disease of native coronary artery without angina pectoris: Secondary | ICD-10-CM | POA: Insufficient documentation

## 2013-09-02 DIAGNOSIS — Z9889 Other specified postprocedural states: Secondary | ICD-10-CM | POA: Insufficient documentation

## 2013-09-02 DIAGNOSIS — I2589 Other forms of chronic ischemic heart disease: Secondary | ICD-10-CM | POA: Insufficient documentation

## 2013-09-02 DIAGNOSIS — J449 Chronic obstructive pulmonary disease, unspecified: Secondary | ICD-10-CM | POA: Insufficient documentation

## 2013-09-02 DIAGNOSIS — F17200 Nicotine dependence, unspecified, uncomplicated: Secondary | ICD-10-CM | POA: Insufficient documentation

## 2013-09-02 DIAGNOSIS — E785 Hyperlipidemia, unspecified: Secondary | ICD-10-CM | POA: Insufficient documentation

## 2013-09-02 HISTORY — PX: ABDOMINAL ANGIOGRAM: SHX5499

## 2013-09-02 HISTORY — PX: LOWER EXTREMITY ANGIOGRAM: SHX5508

## 2013-09-02 LAB — GLUCOSE, CAPILLARY: GLUCOSE-CAPILLARY: 119 mg/dL — AB (ref 70–99)

## 2013-09-02 SURGERY — ANGIOGRAM, LOWER EXTREMITY
Anesthesia: LOCAL

## 2013-09-02 MED ORDER — HEPARIN (PORCINE) IN NACL 2-0.9 UNIT/ML-% IJ SOLN
INTRAMUSCULAR | Status: AC
  Filled 2013-09-02: qty 1000

## 2013-09-02 MED ORDER — GUAIFENESIN-CODEINE 100-10 MG/5ML PO SOLN
5.0000 mL | ORAL | Status: DC | PRN
  Administered 2013-09-02: 5 mL via ORAL

## 2013-09-02 MED ORDER — ASPIRIN EC 325 MG PO TBEC: 325.0000 mg | DELAYED_RELEASE_TABLET | Freq: Every day | ORAL | Status: DC

## 2013-09-02 MED ORDER — DIAZEPAM 5 MG PO TABS
5.0000 mg | ORAL_TABLET | ORAL | Status: AC
  Administered 2013-09-02: 5 mg via ORAL

## 2013-09-02 MED ORDER — SODIUM CHLORIDE 0.9 % IJ SOLN: 3.0000 mL | INTRAMUSCULAR | Status: DC | PRN

## 2013-09-02 MED ORDER — MORPHINE SULFATE 2 MG/ML IJ SOLN: 1.0000 mg | INTRAMUSCULAR | Status: DC | PRN

## 2013-09-02 MED ORDER — ASPIRIN 81 MG PO CHEW
CHEWABLE_TABLET | ORAL | Status: AC
  Administered 2013-09-02: 81 mg via ORAL
  Filled 2013-09-02: qty 1

## 2013-09-02 MED ORDER — DIAZEPAM 5 MG PO TABS
ORAL_TABLET | ORAL | Status: AC
  Administered 2013-09-02: 5 mg via ORAL
  Filled 2013-09-02: qty 1

## 2013-09-02 MED ORDER — ASPIRIN 81 MG PO CHEW
81.0000 mg | CHEWABLE_TABLET | ORAL | Status: AC
  Administered 2013-09-02: 81 mg via ORAL

## 2013-09-02 MED ORDER — GUAIFENESIN-CODEINE 100-10 MG/5ML PO SOLN
ORAL | Status: AC
  Filled 2013-09-02: qty 5

## 2013-09-02 MED ORDER — SODIUM CHLORIDE 0.9 % IV SOLN
INTRAVENOUS | Status: DC
  Administered 2013-09-02: 09:00:00 via INTRAVENOUS

## 2013-09-02 MED ORDER — LIDOCAINE HCL (PF) 1 % IJ SOLN
INTRAMUSCULAR | Status: AC
  Filled 2013-09-02: qty 30

## 2013-09-02 MED ORDER — SODIUM CHLORIDE 0.9 % IV SOLN: INTRAVENOUS | Status: AC

## 2013-09-02 MED ORDER — LEVALBUTEROL HCL 0.63 MG/3ML IN NEBU
0.6300 mg | INHALATION_SOLUTION | Freq: Once | RESPIRATORY_TRACT | Status: AC
  Administered 2013-09-02: 0.63 mg via RESPIRATORY_TRACT
  Filled 2013-09-02: qty 3

## 2013-09-02 NOTE — H&P (View-Only) (Signed)
08/13/2013 Jose Mcgrath   Aug 31, 1940  [id number]  Primary Physician Leonides Grills, MD Primary Cardiologist: Lorretta Harp MD Renae Gloss   HPI:  [name] is a 73 year old chronically ill-appearing married Caucasian male father of one living daughter who I am seeing because of claudication. He was previously a patient of Dr. Terance Ice.he recently had a ICD generator change by Dr. Crissie Sickles. He has had a previously placed a biventricular ICD by Dr. Rollene Fare. His other problems include history of CAD status post coronary artery bypass grafting in 1999 for multivessel disease. He has ischemic cardiomyopathy. He has also had abdominal aortic aneurysm resected and grafted by Dr. Erskin Burnet in 1999. His other problems include continued tobacco abuse with COPD, a bolus of diabetes, hyperlipidemia. He has chronic atrial fibrillation and was on Coumadin anticoagulation in the past which was stopped because of GI bleed. He has progressive bilateral lower extremity lifestyle limiting claudication over the last year with Dopplers performed one year ago that showed SFA and tibial vessel disease. Dopplers performed today revealed occluded SFAs bilaterally. He does have 1 vessel runoff and a progressive painful right heel ulcer. I'm going to arrange for him to undergo angiography via left femoral approach to define his anatomy and help strategize a revascularization strategy for limb salvage    Current Outpatient Prescriptions  Medication Sig Dispense Refill  . carvedilol (COREG) 25 MG tablet Take 37.5 mg by mouth 2 (two) times daily with a meal.      . digoxin (LANOXIN) 0.25 MG tablet Take 0.25 mg by mouth every morning.      . furosemide (LASIX) 40 MG tablet Take 60 mg by mouth daily.      [name] losartan (COZAAR) 50 MG tablet Take 50 mg by mouth every morning.      [name] omeprazole (PRILOSEC) 20 MG capsule TAKE ONE (1) CAPSULE BY MOUTH TWICE A DAY. (EVERY 12 HOURS.)  60 capsule   5  . spironolactone (ALDACTONE) 25 MG tablet Take 25 mg by mouth every morning.       No current facility-administered medications for this visit.    No Known Allergies  History   Social History  . Marital Status: Married    Spouse Name: N/A    Number of Children: N/A  . Years of Education: N/A   Occupational History  . Not on file.   Social History Main Topics  . Smoking status: Current Every Day Smoker -- 1.00 packs/day    Types: Cigarettes  . Smokeless tobacco: Never Used  . Alcohol Use: No     Comment: remote hx of ETOH abuse in his 20s-30s  . Drug Use: No  . Sexual Activity: Not on file   Other Topics Concern  . Not on file   Social History Narrative  . No narrative on file     Review of Systems: General: negative for chills, fever, night sweats or weight changes.  Cardiovascular: negative for chest pain, dyspnea on exertion, edema, orthopnea, palpitations, paroxysmal nocturnal dyspnea or shortness of breath Dermatological: negative for rash Respiratory: negative for cough or wheezing Urologic: negative for hematuria Abdominal: negative for nausea, vomiting, diarrhea, bright red blood per rectum, melena, or hematemesis Neurologic: negative for visual changes, syncope, or dizziness All other systems reviewed and are otherwise negative except as noted above.    Blood pressure 138/82, pulse 84, height 6\' 2"  (1.88 m), weight 186 lb 9.6 oz (84.641 kg).  General appearance: alert and no  distress Neck: no adenopathy, no JVD, supple, symmetrical, trachea midline, thyroid not enlarged, symmetric, no tenderness/mass/nodules and soft right carotid bruit Lungs: clear to auscultation bilaterally Heart: irregularly irregular rhythm Extremities: present 2 cm gangrenous appearing right heel ulcer  EKG not performed today  ASSESSMENT AND PLAN:   Critical lower limb ischemia Patient has a history of peripheral arterial disease. He's had aortobifemoral bypass grafting in  the past. Lower extremity arterial Doppler studies performed one year ago revealed ABIs of 0.70 bilaterally with moderate SFA disease and one vessel runoff. Because of worsening claudication as well as a new right heel ulcer Dopplers were performed today revealing occluded SFAs bilaterally. He would angiography to determine his anatomy and assist in planning a revascularization strategy.      Lorretta Harp MD FACP,FACC,FAHA, [facility] 08/13/2013 12:13 PM

## 2013-09-02 NOTE — CV Procedure (Signed)
[name] is a 73 y.o. male    [id number] LOCATION:  FACILITY: [facility]  PHYSICIAN: Quay Burow, M.D. 07/19/1940   DATE OF PROCEDURE:  09/02/2013  DATE OF DISCHARGE:     PV Angiogram/Intervention    History obtained from chart review.[name] is a 73 year old chronically ill-appearing married Caucasian male father of one living daughter who I am seeing because of claudication. He was previously a patient of Dr. Terance Ice.he recently had a ICD generator change by Dr. Crissie Sickles. He has had a previously placed a biventricular ICD by Dr. Rollene Fare. His other problems include history of CAD status post coronary artery bypass grafting in 1999 for multivessel disease. He has ischemic cardiomyopathy. He has also had abdominal aortic aneurysm resected and grafted by Dr. Erskin Burnet in 1999. His other problems include continued tobacco abuse with COPD, diabetes, hyperlipidemia. He has chronic atrial fibrillation and was on Coumadin anticoagulation in the past which was stopped because of GI bleed. He has progressive bilateral lower extremity lifestyle limiting claudication over the last year with Dopplers performed one year ago that showed SFA and tibial vessel disease. He presents now for angiography to define his anatomy.    PROCEDURE DESCRIPTION:   The patient was brought to the second floor [facility] cath lab in the postabsorptive state. He waspremedicated with Valium 5 mg by mouth. His right groinwas prepped and shaved in usual sterile fashion. Xylocaine 1% was used  for local anesthesia. A 5 French sheath was inserted into the right common femoral artery using standard Seldinger technique.a 5 French pigtail catheter was placed in the distal domino. Distal abdominal aortography, bilateral iliac angiography and bifemoral runoff were performed using all his chasten digital subtraction step staple technique. Visipaque dye was used for the entirety of the case (117 cc  administered to the patient). Retrograde aortic pressure was monitored during the case.  HEMODYNAMICS:    AO SYSTOLIC/AO DIASTOLIC: 332/95   Angiographic Data:   1: Distal abdominal aortogram-the aortobifem was intact including both limbs of the Y. Dacron graft  2: Left lower extremity-occluded left SFA at its origin reconstitutes in in the adductor canal by profunda femoris collaterals. The entire SFA and tibial vessels were fluoroscopically calcified. There is 1 vessel runoff via the peroneal artery.  3: Right lower extremity-occluded right SFA at its origin with reconstitution in the adductor canal by profunda femoris collaterals. There was one vessel runoff via the peroneal artery.  IMPRESSION:Mr. Kuang has an intact aortobifemoral bypass graft with occluded SFAs bilaterally and 1 vessel runoff via peroneal. I do not believe that there are  Percutaneous options and his surgical options are limited by his comorbidities. The sheath was removed and pressure was held on the groin to achieve hemostasis. The patient left the lab in stable condition. He will be gently hydrated, and remain recumbent for 4 hours after which he'll be discharged home. He will be seen back in the office in 2-3 weeks.    Lorretta Harp. MD, [facility] 09/02/2013 12:04 PM

## 2013-09-02 NOTE — Interval H&P Note (Signed)
History and Physical Interval Note:  09/02/2013 12:02 PM  Jose Mcgrath  has presented today for surgery, with the diagnosis of PVD  The various methods of treatment have been discussed with the patient and family. After consideration of risks, benefits and other options for treatment, the patient has consented to  Procedure(s): LOWER EXTREMITY ANGIOGRAM (N/A) as a surgical intervention .  The patient's history has been reviewed, patient examined, no change in status, stable for surgery.  I have reviewed the patient's chart and labs.  Questions were answered to the patient's satisfaction.     Lorretta Harp

## 2013-09-02 NOTE — Discharge Instructions (Signed)
Angiography, Care After Refer to this sheet in the next few weeks. These instructions provide you with information on caring for yourself after your procedure. Your health care provider may also give you more specific instructions. Your treatment has been planned according to current medical practices, but problems sometimes occur. Call your health care provider if you have any problems or questions after your procedure.  WHAT TO EXPECT AFTER THE PROCEDURE After your procedure, it is typical to have the following sensations:  Minor discomfort or tenderness and a small bump at the catheter insertion site. The bump should usually decrease in size and tenderness within 1 to 2 weeks.  Any bruising will usually fade within 2 to 4 weeks. HOME CARE INSTRUCTIONS   You may need to keep taking blood thinners if they were prescribed for you. Only take over-the-counter or prescription medicines for pain, fever, or discomfort as directed by your health care provider.  Do not apply powder or lotion to the site.  Do not sit in a bathtub, swimming pool, or whirlpool for 5 to 7 days.  You may shower 24 hours after the procedure. Remove the bandage (dressing) and gently wash the site with plain soap and water. Gently pat the site dry.  Inspect the site at least twice daily.  Limit your activity for the first 48 hours. Do not bend, squat, or lift anything over 20 lb (9 kg) or as directed by your health care provider.  Do not drive home if you are discharged the day of the procedure. Have someone else drive you. Follow instructions about when you can drive or return to work. SEEK MEDICAL CARE IF:  You get lightheaded when standing up.  You have drainage (other than a small amount of blood on the dressing).  You have chills.  You have a fever.  You have redness, warmth, swelling, or pain at the insertion site. SEEK IMMEDIATE MEDICAL CARE IF:   You develop chest pain or shortness of breath, feel faint,  or pass out.  You have bleeding, swelling larger than a walnut, or drainage from the catheter insertion site.  You develop pain, discoloration, coldness, or severe bruising in the leg or arm that held the catheter.   If you have heavy bleeding at the site, hold pressure on the site and call 911. MAKE SURE YOU:  Understand these instructions.  Will watch your condition.  Will get help right away if you are not doing well or get worse. Document Released: 11/25/2004 Document Revised: 01/09/2013 Document Reviewed: 10/01/2012 [facility] Patient Information 2014 [place].

## 2013-09-06 ENCOUNTER — Telehealth: Payer: Self-pay | Admitting: Cardiovascular Disease

## 2013-09-06 NOTE — Telephone Encounter (Signed)
Need prior authorization for his Digoxin .25mg .

## 2013-09-06 NOTE — Telephone Encounter (Signed)
Deferred to Curt Bears, RN

## 2013-09-09 NOTE — Telephone Encounter (Signed)
PA form filled out and faxed in.

## 2013-09-12 NOTE — Telephone Encounter (Signed)
PA was APPROVED

## 2013-09-23 ENCOUNTER — Encounter: Payer: Self-pay | Admitting: Internal Medicine

## 2013-09-23 ENCOUNTER — Ambulatory Visit (INDEPENDENT_AMBULATORY_CARE_PROVIDER_SITE_OTHER): Payer: Medicare Other | Admitting: Internal Medicine

## 2013-09-23 VITALS — BP 112/55 | HR 71 | Ht 74.0 in | Wt 201.8 lb

## 2013-09-23 DIAGNOSIS — I509 Heart failure, unspecified: Secondary | ICD-10-CM

## 2013-09-23 DIAGNOSIS — Z95810 Presence of automatic (implantable) cardiac defibrillator: Secondary | ICD-10-CM

## 2013-09-23 DIAGNOSIS — I5022 Chronic systolic (congestive) heart failure: Secondary | ICD-10-CM | POA: Insufficient documentation

## 2013-09-23 LAB — MDC_IDC_ENUM_SESS_TYPE_INCLINIC
Battery Remaining Longevity: 34 mo
Battery Voltage: 2.98 V
Brady Statistic AP VS Percent: 0 %
Brady Statistic RV Percent Paced: 90.53 %
HIGH POWER IMPEDANCE MEASURED VALUE: 190 Ohm
HIGH POWER IMPEDANCE MEASURED VALUE: 29 Ohm
HIGH POWER IMPEDANCE MEASURED VALUE: 37 Ohm
Lead Channel Impedance Value: 171 Ohm
Lead Channel Impedance Value: 399 Ohm
Lead Channel Impedance Value: 4047 Ohm
Lead Channel Pacing Threshold Amplitude: 0.75 V
Lead Channel Pacing Threshold Amplitude: 4.25 V
Lead Channel Pacing Threshold Pulse Width: 0.4 ms
Lead Channel Pacing Threshold Pulse Width: 0.8 ms
Lead Channel Setting Pacing Amplitude: 2 V
MDC IDC MSMT LEADCHNL LV IMPEDANCE VALUE: 361 Ohm
MDC IDC MSMT LEADCHNL LV IMPEDANCE VALUE: 456 Ohm
MDC IDC MSMT LEADCHNL RV SENSING INTR AMPL: 9.375 mV
MDC IDC SESS DTM: 20150504100713
MDC IDC SET LEADCHNL LV PACING AMPLITUDE: 5.5 V
MDC IDC SET LEADCHNL LV PACING PULSEWIDTH: 0.8 ms
MDC IDC SET LEADCHNL RV PACING PULSEWIDTH: 0.4 ms
MDC IDC SET LEADCHNL RV SENSING SENSITIVITY: 0.3 mV
MDC IDC SET ZONE DETECTION INTERVAL: 360 ms
MDC IDC STAT BRADY AP VP PERCENT: 0 %
MDC IDC STAT BRADY AS VP PERCENT: 0 %
MDC IDC STAT BRADY AS VS PERCENT: 0 %
MDC IDC STAT BRADY RA PERCENT PACED: 0 %
Zone Setting Detection Interval: 320 ms
Zone Setting Detection Interval: 350 ms
Zone Setting Detection Interval: 400 ms

## 2013-09-23 NOTE — Assessment & Plan Note (Signed)
His device is working normally. His incision has healed. His output is chronically elevated but stable.

## 2013-09-23 NOTE — Progress Notes (Signed)
HPI [name] returns for followup. He is s/p ICD generator change out. He has severe peripheral vascular disease and is under the evaluation by Dr. Gwenlyn Found. He denies chest pain but does have chronic dyspnea. He is limited by claudication which is present at rest. He also has foot ulcers due to chronic ischemia. No syncope or ICD shock. He is still smoking. No Known Allergies   Current Outpatient Prescriptions  Medication Sig Dispense Refill  . acetaminophen (TYLENOL) 325 MG tablet Take 650 mg by mouth every 6 (six) hours as needed for mild pain, moderate pain or fever.      . carvedilol (COREG) 25 MG tablet Take 37.5 mg by mouth 2 (two) times daily with a meal.      . digoxin (LANOXIN) 0.25 MG tablet Take 0.25 mg by mouth every morning.      . furosemide (LASIX) 40 MG tablet Take 60 mg by mouth daily.      [name] ibuprofen (ADVIL,MOTRIN) 200 MG tablet Take 200 mg by mouth 2 (two) times daily.      [name] losartan (COZAAR) 50 MG tablet Take 50 mg by mouth every morning.      [name] omeprazole (PRILOSEC) 20 MG capsule Take 1 capsule by mouth daily.      [name] spironolactone (ALDACTONE) 25 MG tablet Take 25 mg by mouth every morning.       No current facility-administered medications for this visit.     Past Medical History  Diagnosis Date  . CAD (coronary artery disease)     CABG Sep 03, 1997  . PAD (peripheral artery disease)     claudication  . COPD (chronic obstructive pulmonary disease)   . Hyperlipidemia   . Bladder cancer     s/p resection  . Diabetes mellitus     patient says his not a diabetic  . Ischemic cardiomyopathy   . ICD (implantable cardioverter-defibrillator) battery depletion     recent generator change by Dr. Crissie Sickles  . Atrial fibrillation   . History of abdominal aortic aneurysm repair   . Tobacco abuse   . Critical lower limb ischemia     ROS:   All systems reviewed and negative except as noted in the HPI.   Past Surgical History  Procedure Laterality Date  .  Cardiac defibrillator placement  September 03, 2005; 2013-09-03    BiV ICD 09/03/05 by Dr. Rollene Fare; MDT CRTD gen change 06/2013 by Dr Lovena Le  . Abdominal aortic aneurysm repair    . Cholecystectomy    . Right inguinal hernia    . Transurethral resection of bladder tumor    . Esophagogastroduodenoscopy  06/22/2012     Dr. Oneida Alar: PUD, +H.pylori  . Colonoscopy N/A 06/29/2012    Dr. Oneida Alar: multiple polyps (41)tubular adenomas, colonic AVMs     Family History  Problem Relation Age of Onset  . Colon cancer Sister     DECEASED DEC 09/04/2010, metastatic, diagnosed in her late 18s.      History   Social History  . Marital Status: Married    Spouse Name: N/A    Number of Children: N/A  . Years of Education: N/A   Occupational History  . Not on file.   Social History Main Topics  . Smoking status: Current Every Day Smoker -- 1.00 packs/day    Types: Cigarettes  . Smokeless tobacco: Never Used  . Alcohol Use: No     Comment: remote hx of ETOH abuse in his 20s-30s  .  Drug Use: No  . Sexual Activity: Not on file   Other Topics Concern  . Not on file   Social History Narrative  . No narrative on file     BP 112/55  Pulse 71  Ht 6\' 2"  (0.72 m)  Wt 201 lb 12.8 oz (91.536 kg)  BMI 25.90 kg/m2  Physical Exam:  Chronically ill appearing NAD HEENT: Unremarkable Neck:  No JVD, no thyromegally Back:  No CVA tenderness Lungs:  Clear with no wheezes HEART:  Regular rate rhythm, no murmurs, no rubs, no clicks Abd:  soft, positive bowel sounds, no organomegally, no rebound, no guarding Ext:  absent pulses, 2+ peripheral edema, with ulcers on toe and heel. Skin:  No rashes no nodules Neuro:  CN II through XII intact, motor grossly intact  DEVICE  Normal device function.  See PaceArt for details.   Assess/Plan:

## 2013-09-23 NOTE — Patient Instructions (Signed)
Your physician recommends that you schedule a follow-up appointment in: 12 months with Dr Taylor You will receive a reminder letter two months in advance reminding you to call and schedule your appointment. If you don't receive this letter, please contact our office.     

## 2013-09-23 NOTE — Assessment & Plan Note (Signed)
His ejection fraction has actually improved with BiV pacing. 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 He will continue his current meds.

## 2013-09-26 ENCOUNTER — Encounter: Payer: Self-pay | Admitting: Cardiovascular Disease

## 2013-10-03 ENCOUNTER — Encounter: Payer: Medicare Other | Admitting: Internal Medicine

## 2013-10-08 ENCOUNTER — Telehealth: Payer: Self-pay | Admitting: Cardiovascular Disease

## 2013-10-08 NOTE — Telephone Encounter (Signed)
Forward to [place]

## 2013-10-08 NOTE — Telephone Encounter (Signed)
I called [name] to check on the status of his motorized wheelchair and he states that he received a "regular" wheelchair.  I called Jose Mcgrath and found out that [name] did not qualify for a motorized wheelchair.  I called back to speak with Mrs. Jose Mcgrath and she states that [name] is in so much pain that he is taking 5 or 6 Advil every 6 hours.  They are having a hard time finding a primary care physician and she wants to know is there anything else she can do for her husband.

## 2013-11-06 NOTE — Telephone Encounter (Signed)
It appears that Mr Lacko didn't have a follow up with Dr Gwenlyn Found after his pv procedure months ago.  Please see if he can come in on 7/1 to discuss his pain with Dr Gwenlyn Found.

## 2013-11-07 ENCOUNTER — Telehealth: Payer: Self-pay | Admitting: Cardiovascular Disease

## 2013-11-07 NOTE — Telephone Encounter (Signed)
Left message for patient to call and schedule appointment with Dr. Gwenlyn Found per Brita Romp, R.N.

## 2013-11-08 ENCOUNTER — Telehealth: Payer: Self-pay | Admitting: Cardiovascular Disease

## 2013-11-08 NOTE — Telephone Encounter (Signed)
Pt expired on 2013-11-14,

## 2013-11-20 DEATH — deceased

## 2013-12-10 ENCOUNTER — Encounter: Payer: Self-pay | Admitting: Cardiovascular Disease

## 2014-05-01 ENCOUNTER — Encounter (HOSPITAL_COMMUNITY): Payer: Self-pay | Admitting: Internal Medicine

## 2014-06-10 IMAGING — CR DG ABDOMEN ACUTE W/ 1V CHEST
3 series · 3 of 3 positions shown · non-contrast
Comparison: Prior chest x-ray 12/01/2010; CT abdomen 01/19/2010

CLINICAL DATA: Weakness, fatigue, rectal bleeding

ACUTE ABDOMEN SERIES (ABDOMEN 2 VIEW & CHEST 1 VIEW)

[view not recorded (1 of 3)]
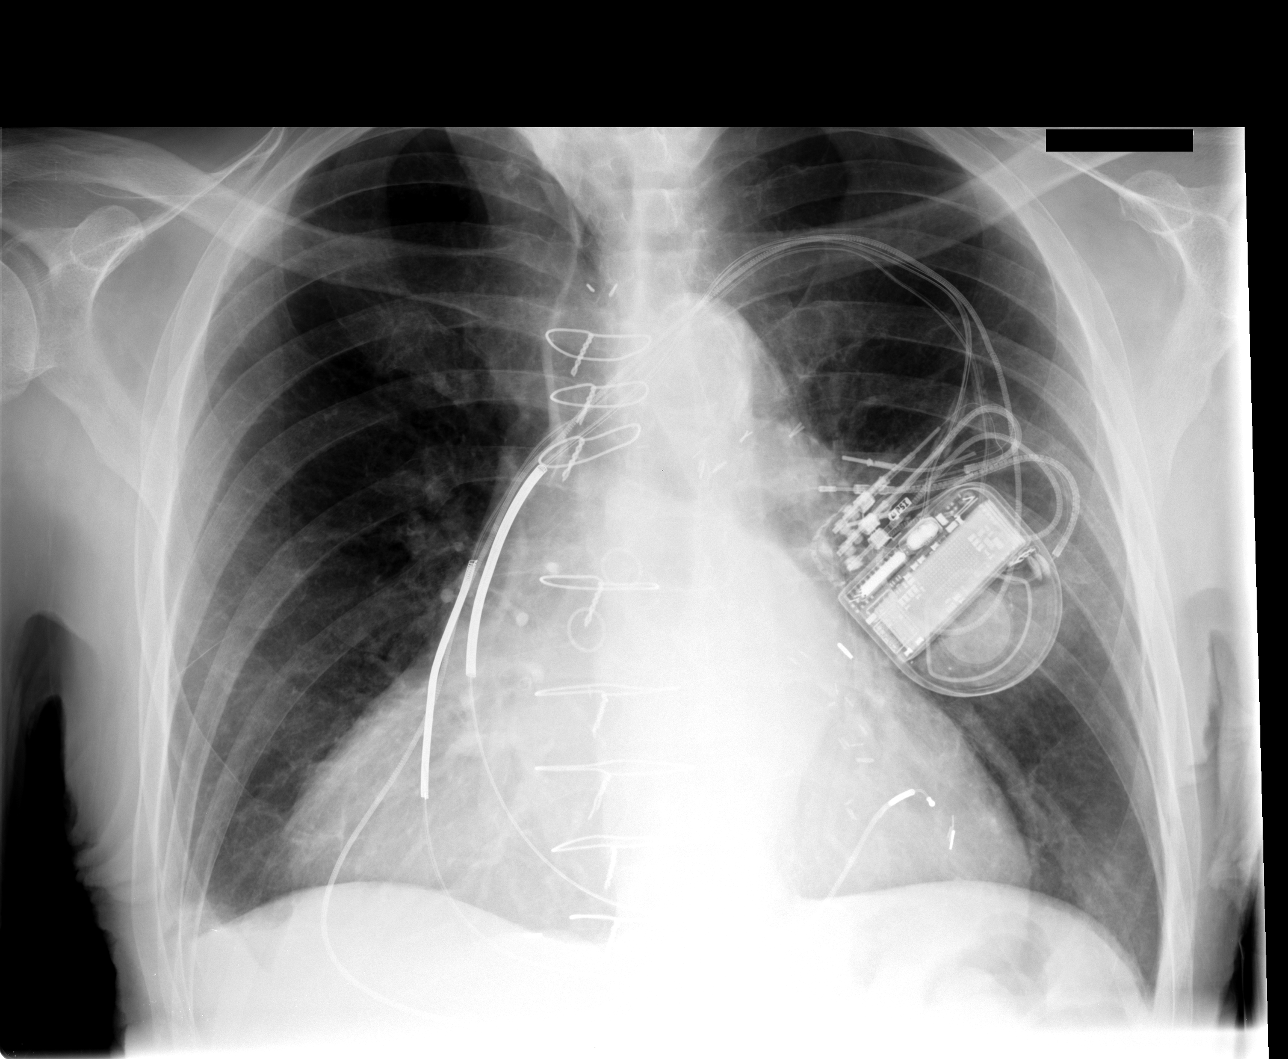

[view not recorded (2 of 3)]
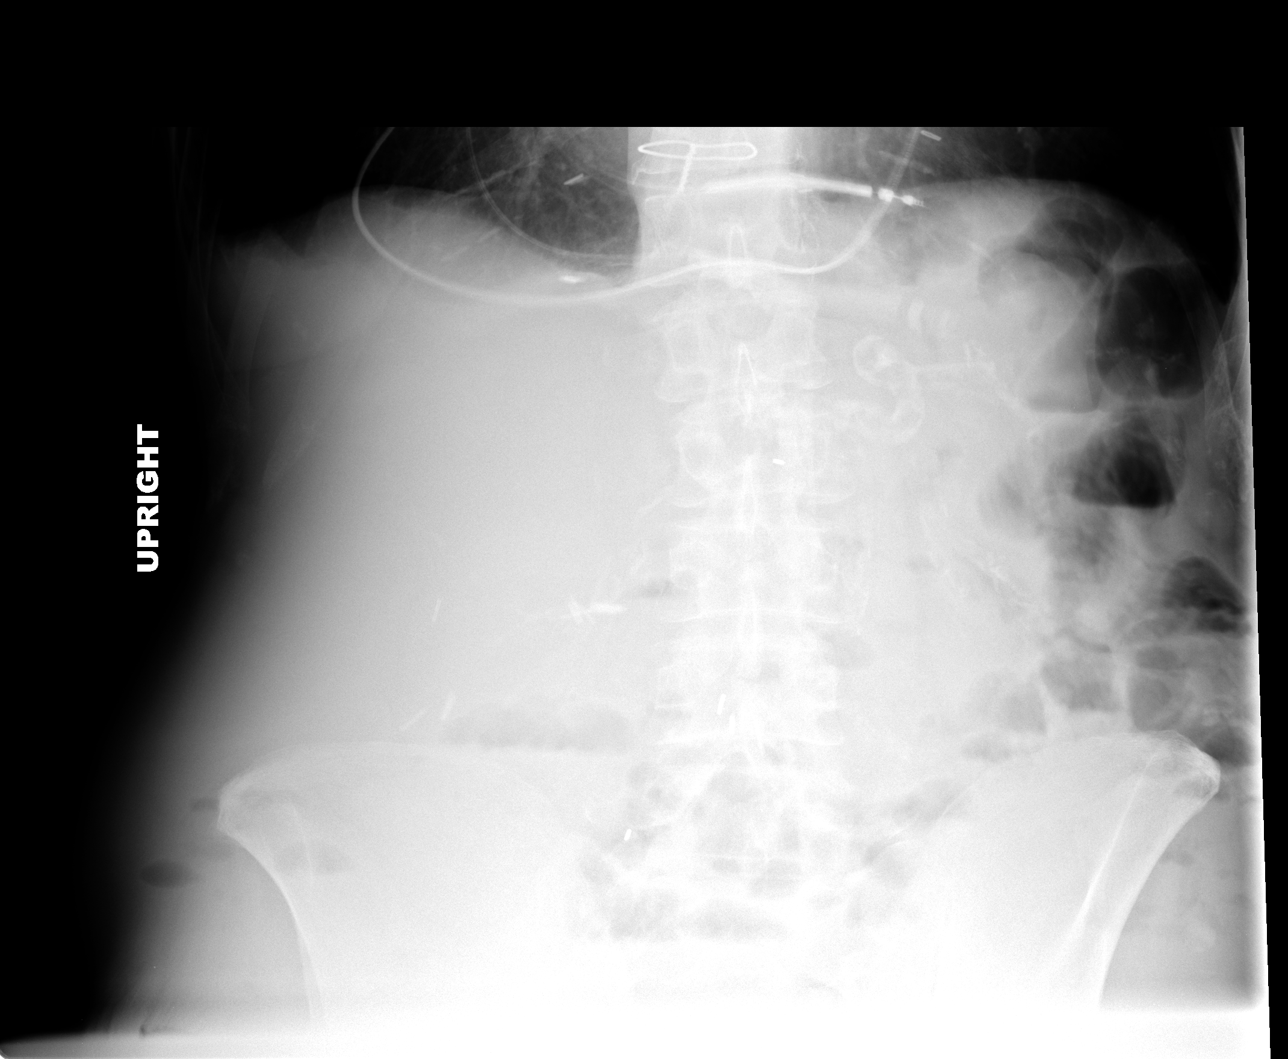

[view not recorded (3 of 3)]
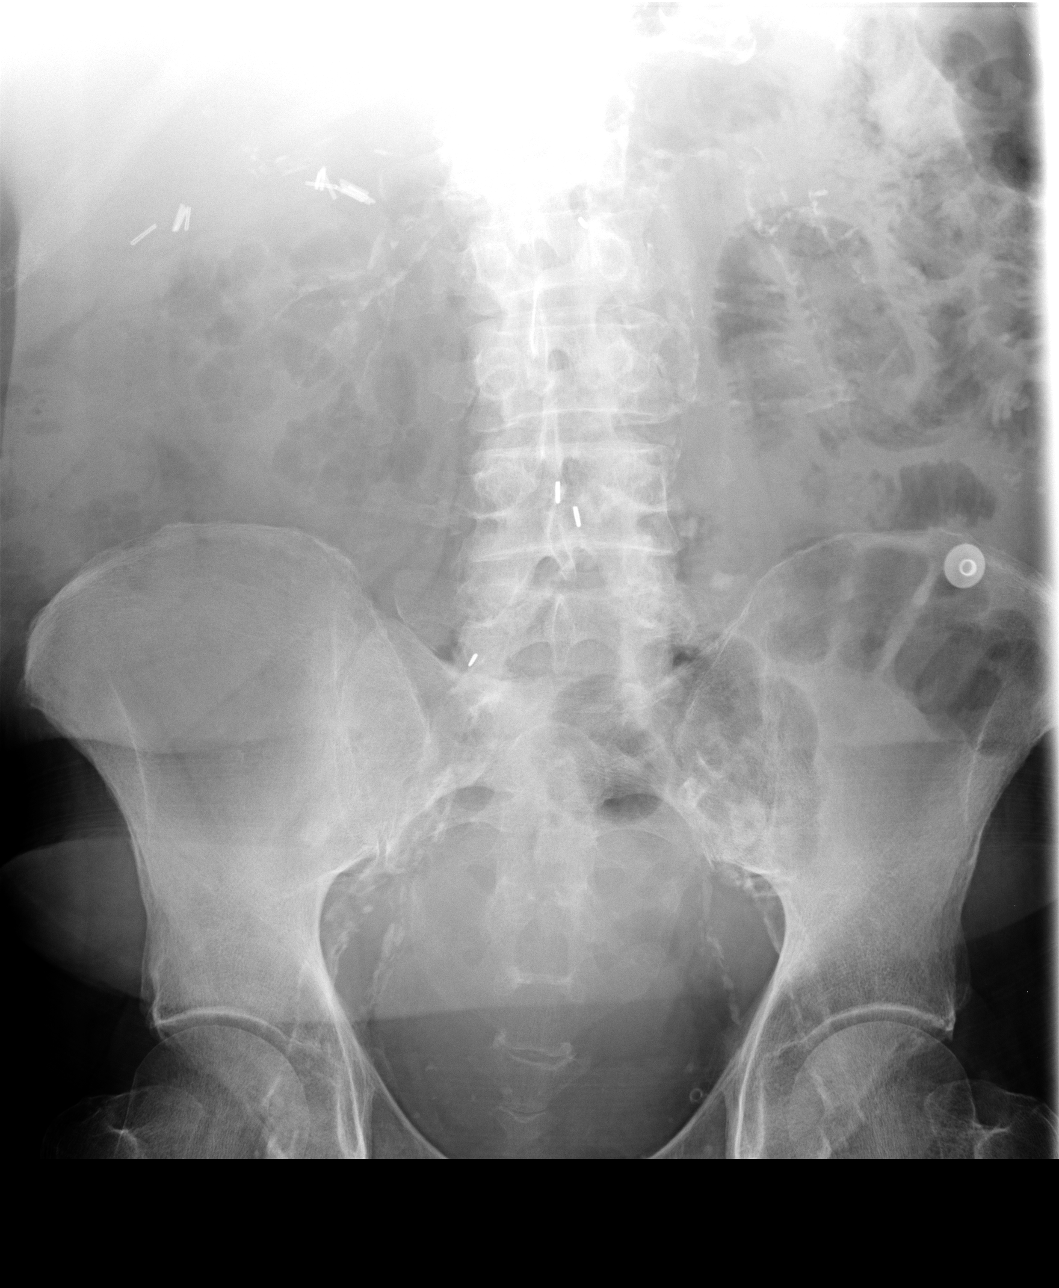

[3 of 3 positions shown; findings below may reference images not displayed]

FINDINGS: Stable marked cardiomegaly with a left subclavian
approach biventricular cardiac rhythm maintenance device.  Chronic
blunting of the right cardiophrenic angle is unchanged.  Negative
for edema, pleural effusion or pneumothorax.  Median sternotomy
with evidence of prior multivessel CABG.

Nonspecific bowel gas pattern.  Gas is noted within both of
nondilated small bowel and colon to the level of the sigmoid colon.
Surgical clips are scattered in the right upper quadrant and mid
abdomen.  Extensive atherosclerotic calcification of the aorta
iliac and splanchnic vessels.  No free air or significant air-fluid
levels on the upright view.
IMPRESSION: 1.  No acute cardiopulmonary disease.  Stable marked cardiomegaly.
2.  Nonspecific, nonobstructed bowel gas pattern.  Extensive
atherosclerotic vascular disease.

## 2015-05-23 IMAGING — CR DG CHEST 2V
2 series · 2 of 2 positions shown · non-contrast
Comparison: 07/09/2012

CLINICAL DATA: Preoperative evaluation for defibrillator placement

EXAM:
CHEST  2 VIEW

[view not recorded (1 of 2)]
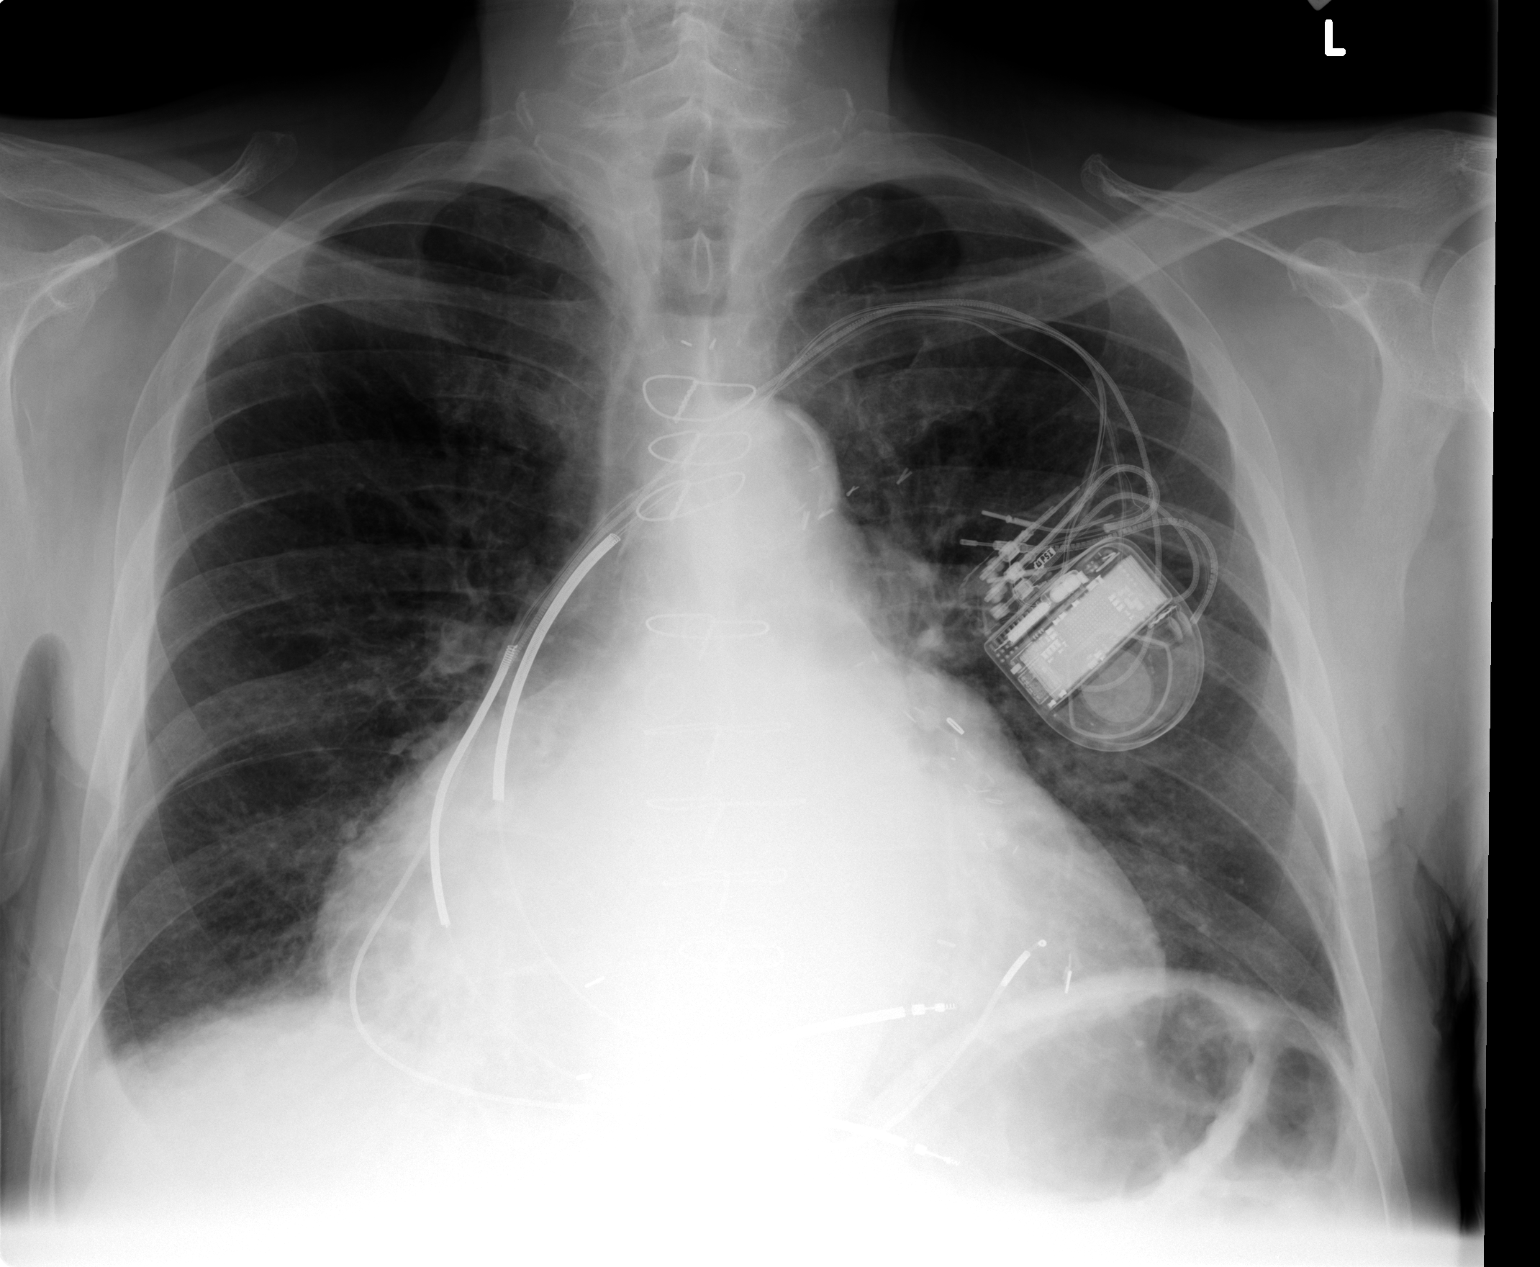

[view not recorded (2 of 2)]
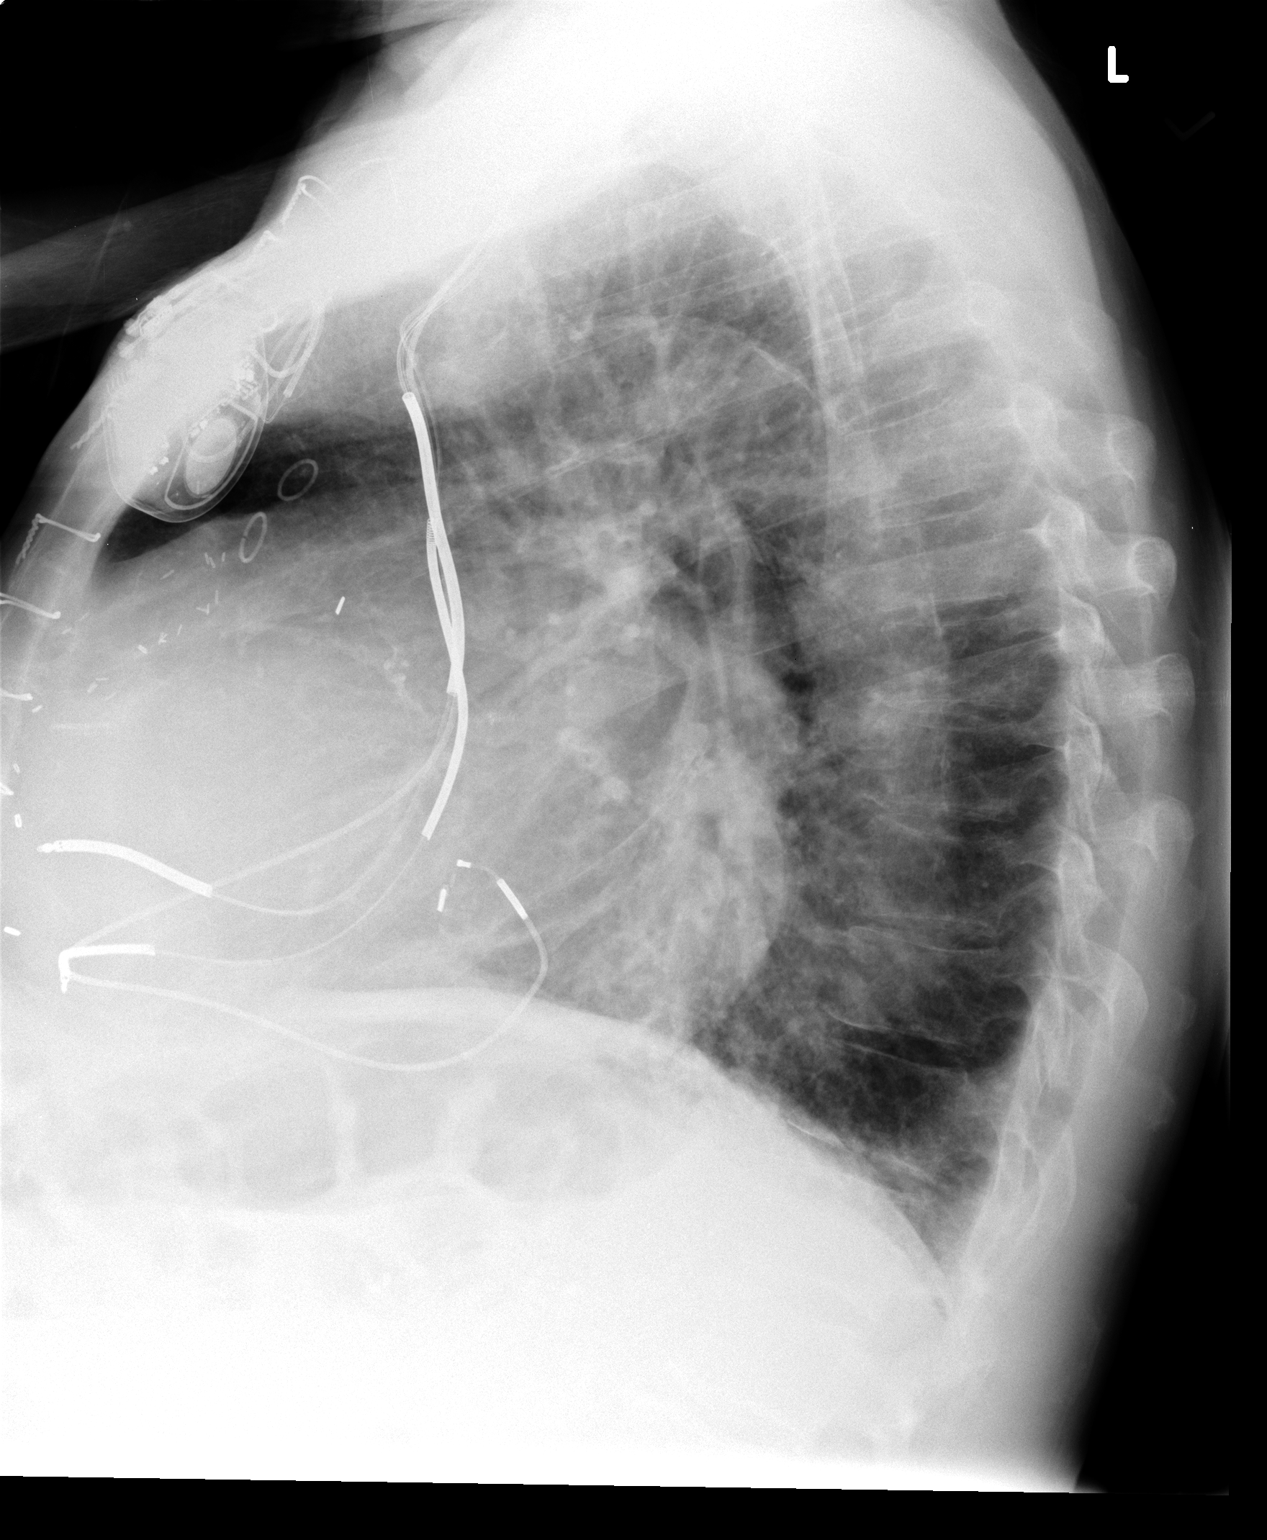

[2 of 2 positions shown; findings below may reference images not displayed]

FINDINGS: Cardiac shadow is again enlarged. Postsurgical changes as well as a
defibrillator on the left are again seen. The lungs are clear
bilaterally. No acute bony abnormality is noted.
IMPRESSION: Chronic changes without acute abnormality.
